# Patient Record
Sex: Female | Born: 1975 | Hispanic: Yes | Marital: Married
Health system: Midwestern US, Community
[De-identification: ages and names within clinical notes are randomized; demographics above are authoritative.]

## PROBLEM LIST (undated history)

## (undated) DIAGNOSIS — Z0000 Encounter for general adult medical examination without abnormal findings: Secondary | ICD-10-CM

## (undated) DIAGNOSIS — N63 Unspecified lump in unspecified breast: Secondary | ICD-10-CM

## (undated) DIAGNOSIS — M797 Fibromyalgia: Secondary | ICD-10-CM

## (undated) DIAGNOSIS — D649 Anemia, unspecified: Secondary | ICD-10-CM

## (undated) DIAGNOSIS — F419 Anxiety disorder, unspecified: Secondary | ICD-10-CM

## (undated) DIAGNOSIS — G8929 Other chronic pain: Secondary | ICD-10-CM

## (undated) DIAGNOSIS — M545 Low back pain, unspecified: Secondary | ICD-10-CM

## (undated) DIAGNOSIS — F329 Major depressive disorder, single episode, unspecified: Secondary | ICD-10-CM

## (undated) DIAGNOSIS — K219 Gastro-esophageal reflux disease without esophagitis: Secondary | ICD-10-CM

## (undated) DIAGNOSIS — F319 Bipolar disorder, unspecified: Secondary | ICD-10-CM

## (undated) DIAGNOSIS — IMO0002 Reserved for concepts with insufficient information to code with codable children: Secondary | ICD-10-CM

## (undated) DIAGNOSIS — F32A Depression, unspecified: Secondary | ICD-10-CM

## (undated) DIAGNOSIS — G43909 Migraine, unspecified, not intractable, without status migrainosus: Secondary | ICD-10-CM

## (undated) DIAGNOSIS — R519 Headache, unspecified: Secondary | ICD-10-CM

## (undated) DIAGNOSIS — R51 Headache: Secondary | ICD-10-CM

## (undated) DIAGNOSIS — Z9289 Personal history of other medical treatment: Secondary | ICD-10-CM

## (undated) DIAGNOSIS — L511 Stevens-Johnson syndrome: Secondary | ICD-10-CM

## (undated) HISTORY — PX: TUBAL LIGATION: SHX77

## (undated) HISTORY — PX: TRIGGER FINGER RELEASE: SHX641

## (undated) HISTORY — PX: LIPOMA EXCISION: SHX5283

## (undated) HISTORY — PX: BACK SURGERY: SHX140

## (undated) HISTORY — PX: CARPAL TUNNEL RELEASE: SHX101

## (undated) HISTORY — PX: LAPAROSCOPIC CHOLECYSTECTOMY: SUR755

---
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
## 2011-04-11 ENCOUNTER — Encounter (HOSPITAL_COMMUNITY)
Admission: RE | Admit: 2011-04-11 | Discharge: 2011-04-11 | Disposition: A | Discharge status: Home / Self Care | Payer: BC Managed Care – PPO | Source: Ambulatory Visit | Attending: Neurosurgery | Admitting: Neurosurgery

## 2011-04-11 LAB — HCG, SERUM, QUALITATIVE: Preg, Serum: NEGATIVE

## 2011-04-11 LAB — CBC
HCT: 33.7 % — ABNORMAL LOW (ref 36.0–46.0)
Hemoglobin: 11.2 g/dL — ABNORMAL LOW (ref 12.0–15.0)
MCH: 26.7 pg (ref 26.0–34.0)
MCHC: 33.2 g/dL (ref 30.0–36.0)
MCV: 80.2 fL (ref 78.0–100.0)
Platelets: 271 10*3/uL (ref 150–400)
RBC: 4.2 MIL/uL (ref 3.87–5.11)
RDW: 14 % (ref 11.5–15.5)
WBC: 8.2 10*3/uL (ref 4.0–10.5)

## 2011-04-11 LAB — SURGICAL PCR SCREEN
MRSA, PCR: NEGATIVE
Staphylococcus aureus: NEGATIVE

## 2011-04-16 ENCOUNTER — Observation Stay (HOSPITAL_COMMUNITY)
Admission: RE | Admit: 2011-04-16 | Discharge: 2011-04-17 | Disposition: A | Discharge status: Home / Self Care | Payer: BC Managed Care – PPO | Source: Ambulatory Visit | Attending: Neurosurgery | Admitting: Neurosurgery

## 2011-04-16 ENCOUNTER — Observation Stay (HOSPITAL_COMMUNITY): Payer: BC Managed Care – PPO

## 2011-04-16 DIAGNOSIS — M5030 Other cervical disc degeneration, unspecified cervical region: Principal | ICD-10-CM | POA: Insufficient documentation

## 2011-04-16 DIAGNOSIS — M5020 Other cervical disc displacement, unspecified cervical region: Secondary | ICD-10-CM | POA: Insufficient documentation

## 2011-04-16 DIAGNOSIS — F341 Dysthymic disorder: Secondary | ICD-10-CM | POA: Insufficient documentation

## 2011-04-16 DIAGNOSIS — Z01812 Encounter for preprocedural laboratory examination: Secondary | ICD-10-CM | POA: Insufficient documentation

## 2011-04-16 DIAGNOSIS — M47812 Spondylosis without myelopathy or radiculopathy, cervical region: Secondary | ICD-10-CM | POA: Insufficient documentation

## 2011-04-16 HISTORY — PX: ANTERIOR CERVICAL DECOMP/DISCECTOMY FUSION: SHX1161

## 2011-04-23 NOTE — Op Note (Signed)
Sherri James, Sherri James             ACCOUNT NO.:  [id number]  MEDICAL RECORD NO.:  [id number]           PATIENT TYPE:  O  LOCATION:  3526                         FACILITY:  [facility]  PHYSICIAN:  Hewitt Shorts, M.D.DATE OF BIRTH:  1976/10/09  DATE OF PROCEDURE:  04/16/2011 DATE OF DISCHARGE:                              OPERATIVE REPORT   PREOPERATIVE DIAGNOSIS:  Cervical disk herniation, cervical spondylosis, cervical degenerative disk disease, and cervical radiculopathy.  POSTOPERATIVE DIAGNOSIS:  Cervical disk herniation, cervical spondylosis, cervical degenerative disk disease, and cervical radiculopathy.  PROCEDURE:  C4-5, C5-6, and C6-7 anterior cervical decompression arthrodesis with allograft and tether cervical plating.  SURGEON:  Hewitt Shorts, MD  ASSISTANT:  Hilda Lias, MD  ANESTHESIA:  General endotracheal.  INDICATIONS:  The patient is a 35 year old woman who presented with neck pain, headaches, and pain, numbness, and tingling extending down to the upper extremities bilaterally, left worse than right.  She has been having symptoms dating back to 2005.  They have been steadily worsening. MRI scan shows three-level degeneration with spondylotic disk herniations and degenerative disk disease and a decision was made proceed with decompression and arthrodesis.  PROCEDURE:  The patient was brought to the operating room and placed under general endotracheal anesthesia.  The patient was placed in 10 pounds of Holter traction.  Neck was prepped with Betadine and soap solution and draped in sterile fashion.  Horizontal incision was made on the left side of the neck.  The line of the incision was infiltrated with local anesthetic with epinephrine.  Incision was made, carried down through the subcutaneous tissue and platysma.  Bipolar cautery and electrocautery were used to maintain hemostasis.  Dissection was then carried out through an avascular plane in the  sternocleidomastoid, carotid artery and jugular vein laterally and trachea and esophagus medially.  The ventral aspect of the vertebral column was identified and localizing x-ray was taken and the C4-5, C5-6, and C6-7 intervertebral disk space was identified.  Diskectomy was began at each level with incision of the annulus and continued with microcurettes and pituitary rongeurs.  Anterior osteophytic overgrowth was removed at each level using the osteophyte removal tool and Kerrison punches.  The operating microscope was draped and brought into the field to provide additional navigation, illumination, and visualization.  The remainder of decompression was performed using microdissection and microsurgical technique.  Diskectomy was continued posteriorly through the disk space. The endplates were prepared using microcurettes and the high-speed drill and then posterior osteophytic overgrowth was removed at each level using the high-speed drill along with a 2-mm Kerrison punch with a thin footplate.  Posterior longitudinal ligament was removed along with the spondylotic disk herniation at each level.  We were able to compress the spinal canal and thecal sac at each level.  We then turned our attention to the neural foramen where spinal encroaching was removed using a 2-mm Kerrison punch with thin footplate.  Once the decompression was completed of the neural foramina and exiting nerve roots at each level, we measured the height of the intervertebral disk spaces and selected 6-mm allograft implants.  Each of these allograft implants was  hydrated in a saline solution and then positioned in the intervertebral disk space and countersunk.  We did place some Gelfoam lateral to the graft for hemostasis and then we selected a 43-mm tether cervical plate and was positioned over the fusion construct and secured to the vertebra with 4 x 13 mm screws placing a single fixed screw at C4, single variable  screw at C5, and a single fixed screw at C6, and a pair of variable screws at C7.  Each screw hole was drilled and tapped.  Once all five screws were placed, final tightening was done.  At the C4 level, we only placed a screw in the left screw hole of the plate, no screw was placed in the right side. This plate itself was positioned slightly tilted to the right superiorly and it was felt that a screw at the right-sided C4 would not have good purchase with the vertebral body; however, solid construct was felt to have been achieved.  X-ray showed graft in good position, plate and screws in good position, the alignment was good and then the wound was irrigated with bacitracin solution, checked for hemostasis, which was established and confirmed.  Then, we proceeded with closure.  Platysma was closed with interrupted inverted 2-0 undyed Vicryl sutures. Subcutaneous and subcuticular were closed with interrupted 3-0 undyed Vicryl sutures.  Skin was approximated with Dermabond.  The procedure was tolerated well.  Estimated blood loss was 250 mL.  Sponge and needles count correct.  Following surgery, the patient was taken out of cervical traction.  The anesthetic was reversed.  The patient was extubated and transferred to recovery room for further care where she was noted to be moving all four extremities to command.     Hewitt Shorts, M.D.     RWN/MEDQ  D:  04/16/2011  T:  04/17/2011  Job:  [id number]  Electronically Signed by Shirlean Kelly M.D. on 04/23/2011 09:48:27 AM

## 2011-05-21 LAB — TYPE & SCREEN
ABO/Rh(D): O POS
Antibody screen: NEGATIVE

## 2011-05-21 LAB — CBC WITH AUTOMATED DIFF
ABS. BASOPHILS: 0 10*3/uL (ref 0.0–0.1)
ABS. EOSINOPHILS: 0 10*3/uL (ref 0.0–0.7)
ABS. LYMPHOCYTES: 1.4 10*3/uL — ABNORMAL LOW (ref 1.5–3.5)
ABS. MONOCYTES: 0.5 10*3/uL (ref 0.0–1.0)
ABS. NEUTROPHILS: 7.6 10*3/uL — ABNORMAL HIGH (ref 1.5–6.6)
BASOPHILS: 0 % (ref 0.0–3.0)
EOSINOPHILS: 0 % (ref 0.0–7.0)
HCT: 40 % (ref 36.0–47.0)
HGB: 13.8 g/dL (ref 12.0–16.0)
IMMATURE GRANULOCYTES: 1 % (ref 0.0–2.0)
LYMPHOCYTES: 14 % — ABNORMAL LOW (ref 18.0–40.0)
MCH: 31.3 PG (ref 27.0–35.0)
MCHC: 34.5 g/dL (ref 30.7–37.3)
MCV: 90.7 FL (ref 81.0–94.0)
MONOCYTES: 6 % (ref 2.0–12.0)
MPV: 12.2 FL — ABNORMAL HIGH (ref 9.2–11.8)
NEUTROPHILS: 80 % — ABNORMAL HIGH (ref 48.0–72.0)
PLATELET: 170 10*3/uL (ref 130–400)
RBC: 4.41 M/uL (ref 4.20–5.40)
RDW: 13.1 % (ref 11.5–14.0)
WBC: 9.7 10*3/uL (ref 4.8–10.6)

## 2011-05-21 LAB — TYPE AND SCREEN
ABO/Rh: O POS
Antibody Screen: NEGATIVE

## 2011-05-22 LAB — CBC WITH AUTOMATED DIFF
ABS. BASOPHILS: 0 10*3/uL (ref 0.0–0.1)
ABS. EOSINOPHILS: 0.1 10*3/uL (ref 0.0–0.7)
ABS. LYMPHOCYTES: 2.4 10*3/uL (ref 1.5–3.5)
ABS. MONOCYTES: 0.7 10*3/uL (ref 0.0–1.0)
ABS. NEUTROPHILS: 6.6 10*3/uL (ref 1.5–6.6)
BASOPHILS: 0 % (ref 0.0–3.0)
EOSINOPHILS: 1 % (ref 0.0–7.0)
HCT: 33.1 % — ABNORMAL LOW (ref 36.0–47.0)
HGB: 11.5 g/dL — ABNORMAL LOW (ref 12.0–16.0)
IMMATURE GRANULOCYTES: 0.7 % (ref 0.0–2.0)
LYMPHOCYTES: 24 % (ref 18.0–40.0)
MCH: 31.8 PG (ref 27.0–35.0)
MCHC: 34.7 g/dL (ref 30.7–37.3)
MCV: 91.4 FL (ref 81.0–94.0)
MONOCYTES: 7 % (ref 2.0–12.0)
MPV: 11.9 FL — ABNORMAL HIGH (ref 9.2–11.8)
NEUTROPHILS: 68 % (ref 48.0–72.0)
PLATELET: 176 10*3/uL (ref 130–400)
RBC: 3.62 M/uL — ABNORMAL LOW (ref 4.20–5.40)
RDW: 13.3 % (ref 11.5–14.0)
WBC: 9.8 10*3/uL (ref 4.8–10.6)

## 2012-07-07 ENCOUNTER — Encounter (INDEPENDENT_AMBULATORY_CARE_PROVIDER_SITE_OTHER): Payer: Self-pay | Admitting: General Surgery

## 2013-11-07 ENCOUNTER — Other Ambulatory Visit: Payer: Self-pay | Admitting: Orthopedic Surgery

## 2013-11-25 ENCOUNTER — Encounter (HOSPITAL_COMMUNITY): Payer: Self-pay | Admitting: Respiratory Therapy

## 2013-11-25 NOTE — Pre-Procedure Instructions (Signed)
Leonie GreenMaria Strandberg  11/25/2013   Your procedure is scheduled on: Thursday, Jan. 15th.             (Free Valet Parking is available)   [facility]  2 * 3 at 8:00 AM.  Call this number if you have problems the morning of surgery: [phone]   Remember:   Do not eat food or drink liquids after midnight Wednesday.   Take these medicines the morning of surgery with A SIP OF WATER: Valium. Please take only 1 of the pain medications   Do not wear jewelry, make-up or nail polish.  Do not wear lotions, powders, or perfumes. You may wear deodorant.  Do not shave underarms & legs 48 hours prior to surgery.   Do not bring valuables to the hospital.  [facility] any belongings or valuables.               Contacts, dentures or bridgework may not be worn into surgery.  Leave suitcase in the car. After surgery it may be brought to your room.  For patients admitted to the hospital, discharge time is determined by your treatment team.              Name and phone number of your driver:    Special Instructions: Shower using CHG 2 nights before surgery and the night before surgery.  If you shower the day of surgery use CHG.  Use special wash - you have one bottle of CHG for all showers.  You should use approximately 1/3 of the bottle for each shower.   Please read over the following fact sheets that you were given: Pain Booklet, Blood Transfusion Information, MRSA Information and Surgical Site Infection Prevention

## 2013-11-28 ENCOUNTER — Encounter (HOSPITAL_COMMUNITY): Payer: Self-pay

## 2013-11-28 ENCOUNTER — Ambulatory Visit (HOSPITAL_COMMUNITY)
Admission: RE | Admit: 2013-11-28 | Discharge: 2013-11-28 | Disposition: A | Discharge status: Home / Self Care | Payer: BC Managed Care – PPO | Source: Ambulatory Visit | Attending: Orthopedic Surgery | Admitting: Orthopedic Surgery

## 2013-11-28 ENCOUNTER — Encounter (HOSPITAL_COMMUNITY)
Admission: RE | Admit: 2013-11-28 | Discharge: 2013-11-28 | Disposition: A | Discharge status: Home / Self Care | Payer: BC Managed Care – PPO | Source: Ambulatory Visit | Attending: Orthopedic Surgery | Admitting: Orthopedic Surgery

## 2013-11-28 DIAGNOSIS — Z01818 Encounter for other preprocedural examination: Secondary | ICD-10-CM | POA: Insufficient documentation

## 2013-11-28 DIAGNOSIS — Z01812 Encounter for preprocedural laboratory examination: Secondary | ICD-10-CM | POA: Insufficient documentation

## 2013-11-28 DIAGNOSIS — Z01810 Encounter for preprocedural cardiovascular examination: Secondary | ICD-10-CM | POA: Insufficient documentation

## 2013-11-28 HISTORY — DX: Reserved for concepts with insufficient information to code with codable children: IMO0002

## 2013-11-28 HISTORY — DX: Stevens-Johnson syndrome: L51.1

## 2013-11-28 HISTORY — DX: Bipolar disorder, unspecified: F31.9

## 2013-11-28 HISTORY — DX: Anxiety disorder, unspecified: F41.9

## 2013-11-28 HISTORY — DX: Anemia, unspecified: D64.9

## 2013-11-28 HISTORY — DX: Major depressive disorder, single episode, unspecified: F32.9

## 2013-11-28 HISTORY — DX: Depression, unspecified: F32.A

## 2013-11-28 HISTORY — DX: Fibromyalgia: M79.7

## 2013-11-28 LAB — COMPREHENSIVE METABOLIC PANEL
ALBUMIN: 4.1 g/dL (ref 3.5–5.2)
ALT: 13 U/L (ref 0–35)
AST: 18 U/L (ref 0–37)
Alkaline Phosphatase: 66 U/L (ref 39–117)
BILIRUBIN TOTAL: 0.2 mg/dL — AB (ref 0.3–1.2)
BUN: 7 mg/dL (ref 6–23)
CO2: 27 mEq/L (ref 19–32)
CREATININE: 0.69 mg/dL (ref 0.50–1.10)
Calcium: 9.4 mg/dL (ref 8.4–10.5)
Chloride: 106 mEq/L (ref 96–112)
GFR calc Af Amer: >90 mL/min (ref >90)
GFR calc non Af Amer: >90 mL/min (ref >90)
Glucose, Bld: 71 mg/dL (ref 70–99)
Potassium: 4 mEq/L (ref 3.7–5.3)
Sodium: 144 mEq/L (ref 137–147)
TOTAL PROTEIN: 7.2 g/dL (ref 6.0–8.3)

## 2013-11-28 LAB — URINALYSIS, ROUTINE W REFLEX MICROSCOPIC
BILIRUBIN URINE: NEGATIVE
Glucose, UA: NEGATIVE mg/dL
Hgb urine dipstick: NEGATIVE
Ketones, ur: NEGATIVE mg/dL
NITRITE: NEGATIVE
PH: 6 (ref 5.0–8.0)
PROTEIN: NEGATIVE mg/dL
Specific Gravity, Urine: 1.027 (ref 1.005–1.030)
Urobilinogen, UA: 0.2 mg/dL (ref 0.0–1.0)

## 2013-11-28 LAB — URINE MICROSCOPIC-ADD ON

## 2013-11-28 LAB — CBC WITH DIFFERENTIAL/PLATELET
BASOS ABS: 0 10*3/uL (ref 0.0–0.1)
Basophils Relative: 1 % (ref 0–1)
Eosinophils Absolute: 0.3 10*3/uL (ref 0.0–0.7)
Eosinophils Relative: 4 % (ref 0–5)
HEMATOCRIT: 40.2 % (ref 36.0–46.0)
HEMOGLOBIN: 13.2 g/dL (ref 12.0–15.0)
LYMPHS ABS: 2.8 10*3/uL (ref 0.7–4.0)
LYMPHS PCT: 44 % (ref 12–46)
MCH: 26.1 pg (ref 26.0–34.0)
MCHC: 32.8 g/dL (ref 30.0–36.0)
MCV: 79.4 fL (ref 78.0–100.0)
MONO ABS: 0.4 10*3/uL (ref 0.1–1.0)
MONOS PCT: 7 % (ref 3–12)
NEUTROS ABS: 2.8 10*3/uL (ref 1.7–7.7)
Neutrophils Relative %: 45 % (ref 43–77)
Platelets: 344 10*3/uL (ref 150–400)
RBC: 5.06 MIL/uL (ref 3.87–5.11)
RDW: 17.9 % — AB (ref 11.5–15.5)
WBC: 6.3 10*3/uL (ref 4.0–10.5)

## 2013-11-28 LAB — PROTIME-INR
INR: 1.06 (ref 0.00–1.49)
PROTHROMBIN TIME: 13.6 s (ref 11.6–15.2)

## 2013-11-28 LAB — TYPE AND SCREEN
ABO/RH(D): A POS
ANTIBODY SCREEN: NEGATIVE

## 2013-11-28 LAB — SURGICAL PCR SCREEN
MRSA, PCR: NEGATIVE
Staphylococcus aureus: NEGATIVE

## 2013-11-28 LAB — APTT: APTT: 33 s (ref 24–37)

## 2013-11-28 LAB — ABO/RH: ABO/RH(D): A POS

## 2013-11-28 LAB — HCG, SERUM, QUALITATIVE: Preg, Serum: NEGATIVE

## 2013-11-30 MED ORDER — CEFAZOLIN SODIUM-DEXTROSE 2-3 GM-% IV SOLR: 2.0000 g | INTRAVENOUS | Status: DC

## 2013-12-01 ENCOUNTER — Encounter (HOSPITAL_COMMUNITY): Admission: RE | Payer: Self-pay | Source: Ambulatory Visit

## 2013-12-01 ENCOUNTER — Ambulatory Visit (HOSPITAL_COMMUNITY): Admission: RE | Admit: 2013-12-01 | Payer: Medicaid Other | Source: Ambulatory Visit | Admitting: Orthopedic Surgery

## 2013-12-01 SURGERY — LUMBAR LAMINECTOMY/DECOMPRESSION MICRODISCECTOMY
Anesthesia: General | Laterality: Right

## 2014-01-11 ENCOUNTER — Encounter

## 2014-02-08 ENCOUNTER — Encounter

## 2014-10-08 ENCOUNTER — Emergency Department: Admit: 2014-10-08 | Payer: MEDICAID

## 2014-10-08 ENCOUNTER — Inpatient Hospital Stay
Admit: 2014-10-08 | Discharge: 2014-10-09 | Disposition: A | Discharge status: Home / Self Care | Payer: MEDICAID | Attending: Personal Emergency Response Attendant

## 2014-10-08 DIAGNOSIS — O039 Complete or unspecified spontaneous abortion without complication: Secondary | ICD-10-CM

## 2014-10-08 LAB — CBC WITH AUTOMATED DIFF
ABS. BASOPHILS: 0 10*3/uL (ref 0.0–0.1)
ABS. EOSINOPHILS: 0.1 10*3/uL (ref 0.0–0.7)
ABS. LYMPHOCYTES: 2.2 10*3/uL (ref 1.5–3.5)
ABS. MONOCYTES: 0.7 10*3/uL (ref 0.0–1.0)
ABS. NEUTROPHILS: 6.8 10*3/uL — ABNORMAL HIGH (ref 1.5–6.6)
BASOPHILS: 0 % (ref 0.0–3.0)
EOSINOPHILS: 1 % (ref 0.0–7.0)
HCT: 37.7 % (ref 36.0–47.0)
HGB: 12.3 g/dL (ref 12.0–16.0)
IMMATURE GRANULOCYTES: 0.3 % (ref 0.0–2.0)
LYMPHOCYTES: 22 % (ref 18.0–40.0)
MCH: 28.2 PG (ref 27.0–35.0)
MCHC: 32.6 g/dL (ref 30.7–37.3)
MCV: 86.5 FL (ref 81.0–94.0)
MONOCYTES: 7 % (ref 2.0–12.0)
MPV: 12 FL — ABNORMAL HIGH (ref 9.2–11.8)
NEUTROPHILS: 70 % (ref 48.0–72.0)
PLATELET: 247 10*3/uL (ref 130–400)
RBC: 4.36 M/uL (ref 4.20–5.40)
RDW: 15.3 % — ABNORMAL HIGH (ref 11.5–14.0)
WBC: 9.8 10*3/uL (ref 4.8–10.6)

## 2014-10-08 MED ORDER — SODIUM CHLORIDE 0.9% BOLUS IV
0.9 % | Freq: Once | INTRAVENOUS | Status: AC
Start: 2014-10-08 — End: 2014-10-08
  Administered 2014-10-08: 23:00:00 via INTRAVENOUS

## 2014-10-08 MED FILL — SODIUM CHLORIDE 0.9 % IV: INTRAVENOUS | Qty: 1000

## 2014-10-08 NOTE — ED Notes (Signed)
Pt given cytocel anotheliter of fluid and dcd to home

## 2014-10-08 NOTE — Consults (Signed)
Gynecology History and Physical    Name: Christina Colon MRN: [id number] SSN: [id number]    Date of Birth: 1976/01/10  Age: 38 y.o.  Sex: female       Subjective:      Chief complaint:  Bleeding and pain, [redacted] weeks pregnant    Christina Colon is a 38 y.o. woman pregnant with EDD 04/26/15 who presents to the ED c/o sudden onset of heavy vaginal bleeding with associated cramping. While in triage area, was noted to pass tissue with  An identifiable fetus. Prenatal course had been unremarkable prior to today.     OB History     Gravida Para Term Preterm AB TAB SAB Ectopic Multiple Living    '3 2 2       2        ' History reviewed. No pertinent past medical history.  History reviewed. No pertinent past surgical history.  Social History     Occupational History   ??? Not on file.     Social History Main Topics   ??? Smoking status: Never Smoker    ??? Smokeless tobacco: Not on file   ??? Alcohol Use: No   ??? Drug Use: Not on file   ??? Sexual Activity: Not on file     History reviewed. No pertinent family history.     No Known Allergies  Prior to Admission medications    Medication Sig Start Date End Date Taking? Authorizing Provider   misoprostol (CYTOTEC) 200 mcg tablet Take 2 Tabs by mouth two (2) times a day for 2 doses. Indications: ABORTION, spontaneous 10/08/14 10/09/14 Yes Rae Roam, MD         Review of Systems  Pertinent items are noted in the History of Present Illness.    Objective:     Filed Vitals:    10/08/14 1751   BP: 113/67   Temp: 98.4 ??F (36.9 ??C)   Resp: 22   Height: '5\' 4"'  (1.626 m)   Weight: 72.576 kg (160 lb)   SpO2: 99%       Physical Exam:  Patient without distress.  Abdomen: soft, nontender  Vagina: presence of blood and tissue in vault, extracted with ring forceps  Cervix: normal appearance and closed  Uterus: normal single, nontender, anteverted and 8 week size    Recent Results (from the past 8 hour(s))   TOTAL HCG, QT.    Collection Time: 10/08/14  6:40 PM   Result Value Ref Range     HCG,INTACT 2214 (H) <3 MIU/mL   METABOLIC PANEL, COMPREHENSIVE    Collection Time: 10/08/14  6:40 PM   Result Value Ref Range    Sodium 140 136 - 145 mmol/L    Potassium 3.6 3.5 - 5.1 mmol/L    Chloride 107 98 - 107 mmol/L    CO2 25 21 - 32 mmol/L    Anion gap 12 10 - 20 mmol/L    Glucose 96 74 - 106 mg/dL    BUN 8 7 - 18 mg/dL    Creatinine 0.6 0.6 - 1.3 mg/dL    GFR est AA >60 >60 ml/min/1.47m    GFR est non-AA >60 >60 ml/min/1.747m   Calcium 8.4 (L) 8.5 - 10.1 mg/dL    Bilirubin, total 0.3 0.2 - 1.0 mg/dL    ALT 19 13 - 61 U/L    AST 11 (L) 15 - 37 U/L    Alk. phosphatase 72 45 - 117 U/L    Protein, total 7.5 6.4 -  8.2 g/dL    Albumin 3.6 3.5 - 4.7 g/dL    Globulin 3.9 1.7 - 4.7 g/dL    A-G Ratio 0.9 0.7 - 2.8     CBC WITH AUTOMATED DIFF    Collection Time: 10/08/14  6:40 PM   Result Value Ref Range    WBC 9.8 4.8 - 10.6 K/uL    RBC 4.36 4.20 - 5.40 M/uL    HGB 12.3 12.0 - 16.0 g/dL    HCT 37.7 36.0 - 47.0 %    MCV 86.5 81.0 - 94.0 FL    MCH 28.2 27.0 - 35.0 PG    MCHC 32.6 30.7 - 37.3 g/dL    RDW 15.3 (H) 11.5 - 14.0 %    PLATELET 247 130 - 400 K/uL    MPV 12.0 (H) 9.2 - 11.8 FL    NEUTROPHILS 70 48.0 - 72.0 %    LYMPHOCYTES 22 18.0 - 40.0 %    MONOCYTES 7 2.0 - 12.0 %    EOSINOPHILS 1 0.0 - 7.0 %    BASOPHILS 0 0.0 - 3.0 %    ABS. NEUTROPHILS 6.8 (H) 1.5 - 6.6 K/UL    ABS. LYMPHOCYTES 2.2 1.5 - 3.5 K/UL    ABS. MONOCYTES 0.7 0.0 - 1.0 K/UL    ABS. EOSINOPHILS 0.1 0.0 - 0.7 K/UL    ABS. BASOPHILS 0.0 0.0 - 0.1 K/UL    DF AUTOMATED      IMMATURE GRANULOCYTES 0.3 0.0 - 2.0 %   PROTHROMBIN TIME + INR    Collection Time: 10/08/14  6:40 PM   Result Value Ref Range    Prothrombin time 9.6 9.4 - 11.1 sec    INR 1.0 0.8 - 1.2     PTT    Collection Time: 10/08/14  6:40 PM   Result Value Ref Range    aPTT 24.2 22.0 - 29.0 [place]       [place] reviewed with technician. Uterine corpus measures 12 cm with 1 cm homogeneous endometrium. Marked heterogenous material present at level of  cervix/vagina (study performed prior to above physical exam).      Assessment/Plan:     38 yr old G3P2 [redacted] weeks gestation s/p spontaneous abortion-likely completed. Will give misoprostol and IV fluids prior to discharge, and rx 2 additional doses of misoprostol for patient to take tomorrow.     Signed By:  Rae Roam, MD     October 08, 2014

## 2014-10-08 NOTE — ED Notes (Signed)
Dr POlio here to consult pt pt to have cytotec and 1 more liter saline check blood pressure and dc to home

## 2014-10-08 NOTE — ED Provider Notes (Signed)
HPI Comments: 5:22 PM  Chief complaint: vaginal bleeding  History obtained from patient    Christina Colon is a 38 y.o. female G3P2002 at 13 weeks (by date of LMP 07/06/14) complains of vaginal bleeding (about the same as a regular period) which started approx 1 hour ago. She complains of mild, intermittent, cramping, non-radiating pelvic pain. Has used many pads since onset.  Large clots. Denies dysuria, urinary frequency, urgency or hematuria.  No flank pain.   No fever or chills. No nausea, vomiting, diarrhea or constipation. No weakness, numbness or tingling.    She has had an ultrasound this pregnancy which showed normal results to her knowledge.    OB/GYN: Roe Coombs, MD  Patient's last menstrual period was 07/06/2014.            History reviewed. No pertinent past medical history.;     History reviewed. No pertinent past surgical history.      History reviewed. No pertinent family history.     History     Social History   ??? Marital Status: MARRIED     Spouse Name: N/A     Number of Children: N/A   ??? Years of Education: N/A     Occupational History   ??? Not on file.     Social History Main Topics   ??? Smoking status: Never Smoker    ??? Smokeless tobacco: Not on file   ??? Alcohol Use: No   ??? Drug Use: Not on file   ??? Sexual Activity: Not on file     Other Topics Concern   ??? Not on file     Social History Narrative   ??? No narrative on file                ALLERGIES: Review of patient's allergies indicates no known allergies.      Review of Systems   Constitutional: Negative for fever and chills.   HENT: Negative for sore throat.    Respiratory: Negative for cough.    Cardiovascular: Negative for chest pain.   Gastrointestinal: Negative for nausea, vomiting and abdominal pain.   Genitourinary: Positive for vaginal bleeding. Negative for dysuria and flank pain.   Musculoskeletal: Negative for back pain and neck pain.   Skin: Negative for wound.   Allergic/Immunologic: Negative for immunocompromised state.    Neurological: Negative for dizziness and headaches.       Filed Vitals:    10/08/14 1751   BP: 113/67   Temp: 98.4 ??F (36.9 ??C)   Resp: 22   Height: '5\' 4"'  (1.626 m)   Weight: 72.576 kg (160 lb)   SpO2: 99%            Physical Exam   Constitutional: She is oriented to person, place, and time. Vital signs are normal. She appears well-developed and well-nourished. No distress.   HENT:   Head: Normocephalic and atraumatic.   Eyes: Conjunctivae are normal.   Neck: Normal range of motion. Neck supple.   Cardiovascular: Normal rate and regular rhythm.    Pulmonary/Chest: Effort normal and breath sounds normal.   Abdominal: Soft. She exhibits no distension. There is no tenderness. There is no rebound and no guarding.   Genitourinary:   Large clots in vaginal vault   Neurological: She is alert and oriented to person, place, and time.   Skin: Skin is warm and dry. She is not diaphoretic.   Psychiatric:   Calm and cooperative    Nursing note and vitals  reviewed.       MDM  Number of Diagnoses or Management Options  Spontaneous abortion in first trimester:      Amount and/or Complexity of Data Reviewed  Clinical lab tests: reviewed and ordered  Tests in the radiology section of CPT??: reviewed and ordered  Obtain history from someone other than the patient: yes  Discuss the patient with other providers: yes        Procedures      Labs:  Recent Results (from the past 12 hour(s))   TOTAL HCG, QT.    Collection Time: 10/08/14  6:40 PM   Result Value Ref Range    HCG,INTACT 2214 (H) <3 MIU/mL   METABOLIC PANEL, COMPREHENSIVE    Collection Time: 10/08/14  6:40 PM   Result Value Ref Range    Sodium 140 136 - 145 mmol/L    Potassium 3.6 3.5 - 5.1 mmol/L    Chloride 107 98 - 107 mmol/L    CO2 25 21 - 32 mmol/L    Anion gap 12 10 - 20 mmol/L    Glucose 96 74 - 106 mg/dL    BUN 8 7 - 18 mg/dL    Creatinine 0.6 0.6 - 1.3 mg/dL    GFR est AA >60 >60 ml/min/1.29m    GFR est non-AA >60 >60 ml/min/1.755m   Calcium 8.4 (L) 8.5 - 10.1 mg/dL     Bilirubin, total 0.3 0.2 - 1.0 mg/dL    ALT 19 13 - 61 U/L    AST 11 (L) 15 - 37 U/L    Alk. phosphatase 72 45 - 117 U/L    Protein, total 7.5 6.4 - 8.2 g/dL    Albumin 3.6 3.5 - 4.7 g/dL    Globulin 3.9 1.7 - 4.7 g/dL    A-G Ratio 0.9 0.7 - 2.8     CBC WITH AUTOMATED DIFF    Collection Time: 10/08/14  6:40 PM   Result Value Ref Range    WBC 9.8 4.8 - 10.6 K/uL    RBC 4.36 4.20 - 5.40 M/uL    HGB 12.3 12.0 - 16.0 g/dL    HCT 37.7 36.0 - 47.0 %    MCV 86.5 81.0 - 94.0 FL    MCH 28.2 27.0 - 35.0 PG    MCHC 32.6 30.7 - 37.3 g/dL    RDW 15.3 (H) 11.5 - 14.0 %    PLATELET 247 130 - 400 K/uL    MPV 12.0 (H) 9.2 - 11.8 FL    NEUTROPHILS 70 48.0 - 72.0 %    LYMPHOCYTES 22 18.0 - 40.0 %    MONOCYTES 7 2.0 - 12.0 %    EOSINOPHILS 1 0.0 - 7.0 %    BASOPHILS 0 0.0 - 3.0 %    ABS. NEUTROPHILS 6.8 (H) 1.5 - 6.6 K/UL    ABS. LYMPHOCYTES 2.2 1.5 - 3.5 K/UL    ABS. MONOCYTES 0.7 0.0 - 1.0 K/UL    ABS. EOSINOPHILS 0.1 0.0 - 0.7 K/UL    ABS. BASOPHILS 0.0 0.0 - 0.1 K/UL    DF AUTOMATED      IMMATURE GRANULOCYTES 0.3 0.0 - 2.0 %   PROTHROMBIN TIME + INR    Collection Time: 10/08/14  6:40 PM   Result Value Ref Range    Prothrombin time 9.6 9.4 - 11.1 sec    INR 1.0 0.8 - 1.2     PTT    Collection Time: 10/08/14  6:40 PM   Result  Value Ref Range    aPTT 24.2 22.0 - 29.0 SEC   TYPE & SCREEN    Collection Time: 10/08/14  6:40 PM   Result Value Ref Range    Crossmatch Expiration 10/11/2014     ABO/Rh(D) O POSITIVE     Antibody screen NEG        Lab Results   Component Value Date/Time    ABO/RH(D) O POSITIVE 10/08/2014 06:40 PM       Radiology: Transvaginal US: large blood clots visualized in uterus, no definitive retained POC.     Consults: 6:00 PM Dr. Domenick Gong (covering Dr. Marlana Salvage) made aware that pt is in the ED and diagnostic tests are pending  7:15 PM Dr. Domenick Gong in the ED evaluating patient.     <EMERGENCY DEPARTMENT CASE SUMMARY>    Impression/Differential Diagnosis: spontaneous abortion     Plan: Labs, transvaginal ultrasound, IVF, consult OB/GYN    ED Course: Pt non-distressed in the ED with normal vitals. IVF given. Transvaginal US reveals no definitive evidence of retained POC. Large blood clots seen. Dr. Domenick Gong evaluated pt at bedside and manually removed numerous large blood clots. Pt was monitored and her vitals remained stable. Pt was given cytotec in the ED and will be discharged with an Rx for another dose (called in by Dr. Domenick Gong). Stable for d/c, to follow up with primary OB/GYN in 10 days.    D/w patient and spouse test results, dx, tx plan, and need for follow-up. Pt advised to RTER for new or worsening symptoms. The patient and spouse verbalized understanding. All questions answered, and the patient and spouse agrees with the plan.      Final Impression/Diagnosis:   1. Spontaneous abortion in first trimester        Patient condition at time of disposition:  stable     I have reviewed the following home medications:    Prior to Admission medications    Medication Sig Start Date End Date Taking? Authorizing Provider   misoprostol (CYTOTEC) 200 mcg tablet Take 2 Tabs by mouth two (2) times a day for 2 doses. Indications: ABORTION, spontaneous 10/08/14 10/09/14 Yes Rae Roam, MD       Lurline Idol, NP

## 2014-10-08 NOTE — ED Notes (Signed)
Report given to Ross RN .

## 2014-10-08 NOTE — ED Notes (Signed)
Pt sts 30 min before arrival to ed she began vaginally bleeding. As pt undressed fully formed fetus found in underwear with approx of blood. Clotted blood passed as well. Pt denies pain. NP [place] in to see.

## 2014-10-09 LAB — METABOLIC PANEL, COMPREHENSIVE
A-G Ratio: 0.9 (ref 0.7–2.8)
ALT (SGPT): 19 U/L (ref 13–61)
AST (SGOT): 11 U/L — ABNORMAL LOW (ref 15–37)
Albumin: 3.6 g/dL (ref 3.5–4.7)
Alk. phosphatase: 72 U/L (ref 45–117)
Anion gap: 12 mmol/L (ref 10–20)
BUN: 8 mg/dL (ref 7–18)
Bilirubin, total: 0.3 mg/dL (ref 0.2–1.0)
CO2: 25 mmol/L (ref 21–32)
Calcium: 8.4 mg/dL — ABNORMAL LOW (ref 8.5–10.1)
Chloride: 107 mmol/L (ref 98–107)
Creatinine: 0.6 mg/dL (ref 0.6–1.3)
GFR est AA: >60 mL/min/{1.73_m2} (ref >60)
GFR est non-AA: >60 mL/min/{1.73_m2} (ref >60)
Globulin: 3.9 g/dL (ref 1.7–4.7)
Glucose: 96 mg/dL (ref 74–106)
Potassium: 3.6 mmol/L (ref 3.5–5.1)
Protein, total: 7.5 g/dL (ref 6.4–8.2)
Sodium: 140 mmol/L (ref 136–145)

## 2014-10-09 LAB — PROTHROMBIN TIME + INR
INR: 1 (ref 0.8–1.2)
Prothrombin time: 9.6 s (ref 9.4–11.1)

## 2014-10-09 LAB — BETA HCG, QT
HCG,INTACT, THCGA1: 2214 m[IU]/mL — ABNORMAL HIGH (ref <3)
HCG,INTACT: 2214 m[IU]/mL — ABNORMAL HIGH (ref <3)

## 2014-10-09 LAB — TYPE & SCREEN
ABO/Rh(D): O POS
Antibody screen: NEGATIVE

## 2014-10-09 LAB — PTT: aPTT: 24.2 s (ref 22.0–29.0)

## 2014-10-09 LAB — TYPE AND SCREEN
ABO/Rh: O POS
Antibody Screen: NEGATIVE

## 2014-10-09 MED ORDER — SODIUM CHLORIDE 0.9% BOLUS IV
0.9 % | Freq: Once | INTRAVENOUS | Status: AC
Start: 2014-10-09 — End: 2014-10-08
  Administered 2014-10-09: via INTRAVENOUS

## 2014-10-09 MED ORDER — MISOPROSTOL 200 MCG TAB
200 mcg | Freq: Once | ORAL | Status: DC
Start: 2014-10-09 — End: 2014-10-09

## 2014-10-09 MED ORDER — MISOPROSTOL 200 MCG TAB
200 mcg | ORAL_TABLET | Freq: Two times a day (BID) | ORAL | Status: AC
Start: 2014-10-09 — End: 2014-10-09

## 2014-10-09 MED ORDER — SODIUM CHLORIDE 0.9% BOLUS IV
0.9 % | Freq: Once | INTRAVENOUS | Status: AC
Start: 2014-10-09 — End: 2014-10-08
  Administered 2014-10-09: 01:00:00 via INTRAVENOUS

## 2014-10-09 MED FILL — SODIUM CHLORIDE 0.9 % IV: INTRAVENOUS | Qty: 1000

## 2014-10-09 MED FILL — MISOPROSTOL 200 MCG TAB: 200 mcg | ORAL | Qty: 2

## 2016-08-12 DIAGNOSIS — F329 Major depressive disorder, single episode, unspecified: Secondary | ICD-10-CM

## 2016-08-12 DIAGNOSIS — E871 Hypo-osmolality and hyponatremia: Secondary | ICD-10-CM

## 2016-08-12 DIAGNOSIS — N151 Renal and perinephric abscess: Secondary | ICD-10-CM

## 2016-08-12 DIAGNOSIS — A419 Sepsis, unspecified organism: Secondary | ICD-10-CM

## 2016-08-12 DIAGNOSIS — E876 Hypokalemia: Secondary | ICD-10-CM

## 2016-08-12 DIAGNOSIS — M549 Dorsalgia, unspecified: Secondary | ICD-10-CM

## 2016-08-12 DIAGNOSIS — N12 Tubulo-interstitial nephritis, not specified as acute or chronic: Secondary | ICD-10-CM

## 2016-08-13 DIAGNOSIS — E876 Hypokalemia: Secondary | ICD-10-CM | POA: Diagnosis not present

## 2016-08-13 DIAGNOSIS — N151 Renal and perinephric abscess: Secondary | ICD-10-CM | POA: Diagnosis not present

## 2016-08-13 DIAGNOSIS — N12 Tubulo-interstitial nephritis, not specified as acute or chronic: Secondary | ICD-10-CM | POA: Diagnosis not present

## 2016-08-13 DIAGNOSIS — A419 Sepsis, unspecified organism: Secondary | ICD-10-CM | POA: Diagnosis not present

## 2016-08-14 DIAGNOSIS — N151 Renal and perinephric abscess: Secondary | ICD-10-CM | POA: Diagnosis not present

## 2016-08-14 DIAGNOSIS — E876 Hypokalemia: Secondary | ICD-10-CM | POA: Diagnosis not present

## 2016-08-14 DIAGNOSIS — N12 Tubulo-interstitial nephritis, not specified as acute or chronic: Secondary | ICD-10-CM | POA: Diagnosis not present

## 2016-08-14 DIAGNOSIS — A419 Sepsis, unspecified organism: Secondary | ICD-10-CM | POA: Diagnosis not present

## 2016-08-15 DIAGNOSIS — N12 Tubulo-interstitial nephritis, not specified as acute or chronic: Secondary | ICD-10-CM | POA: Diagnosis not present

## 2016-08-15 DIAGNOSIS — A419 Sepsis, unspecified organism: Secondary | ICD-10-CM | POA: Diagnosis not present

## 2016-08-15 DIAGNOSIS — E876 Hypokalemia: Secondary | ICD-10-CM | POA: Diagnosis not present

## 2016-08-15 DIAGNOSIS — N151 Renal and perinephric abscess: Secondary | ICD-10-CM | POA: Diagnosis not present

## 2016-08-16 DIAGNOSIS — N151 Renal and perinephric abscess: Secondary | ICD-10-CM | POA: Diagnosis not present

## 2016-08-16 DIAGNOSIS — E876 Hypokalemia: Secondary | ICD-10-CM | POA: Diagnosis not present

## 2016-08-16 DIAGNOSIS — N12 Tubulo-interstitial nephritis, not specified as acute or chronic: Secondary | ICD-10-CM | POA: Diagnosis not present

## 2016-08-16 DIAGNOSIS — A419 Sepsis, unspecified organism: Secondary | ICD-10-CM | POA: Diagnosis not present

## 2016-09-10 ENCOUNTER — Emergency Department (HOSPITAL_COMMUNITY): Payer: BLUE CROSS/BLUE SHIELD

## 2016-09-10 ENCOUNTER — Encounter (HOSPITAL_COMMUNITY): Payer: Self-pay | Admitting: Emergency Medicine

## 2016-09-10 ENCOUNTER — Inpatient Hospital Stay (HOSPITAL_COMMUNITY)
Admission: EM | Admit: 2016-09-10 | Discharge: 2016-09-17 | DRG: 184 | Disposition: A | Discharge status: Home Health | Payer: BLUE CROSS/BLUE SHIELD | Attending: Surgery | Admitting: Surgery

## 2016-09-10 DIAGNOSIS — Z981 Arthrodesis status: Secondary | ICD-10-CM

## 2016-09-10 DIAGNOSIS — Y92230 Patient room in hospital as the place of occurrence of the external cause: Secondary | ICD-10-CM | POA: Diagnosis not present

## 2016-09-10 DIAGNOSIS — Z79899 Other long term (current) drug therapy: Secondary | ICD-10-CM

## 2016-09-10 DIAGNOSIS — S81812A Laceration without foreign body, left lower leg, initial encounter: Secondary | ICD-10-CM | POA: Diagnosis present

## 2016-09-10 DIAGNOSIS — F419 Anxiety disorder, unspecified: Secondary | ICD-10-CM | POA: Diagnosis present

## 2016-09-10 DIAGNOSIS — S92321A Displaced fracture of second metatarsal bone, right foot, initial encounter for closed fracture: Secondary | ICD-10-CM | POA: Diagnosis present

## 2016-09-10 DIAGNOSIS — S93324A Dislocation of tarsometatarsal joint of right foot, initial encounter: Secondary | ICD-10-CM

## 2016-09-10 DIAGNOSIS — G8929 Other chronic pain: Secondary | ICD-10-CM | POA: Diagnosis present

## 2016-09-10 DIAGNOSIS — D62 Acute posthemorrhagic anemia: Secondary | ICD-10-CM | POA: Diagnosis not present

## 2016-09-10 DIAGNOSIS — R443 Hallucinations, unspecified: Secondary | ICD-10-CM | POA: Diagnosis not present

## 2016-09-10 DIAGNOSIS — R52 Pain, unspecified: Secondary | ICD-10-CM | POA: Diagnosis not present

## 2016-09-10 DIAGNOSIS — R262 Difficulty in walking, not elsewhere classified: Secondary | ICD-10-CM

## 2016-09-10 DIAGNOSIS — T404X5A Adverse effect of other synthetic narcotics, initial encounter: Secondary | ICD-10-CM | POA: Diagnosis not present

## 2016-09-10 DIAGNOSIS — G43909 Migraine, unspecified, not intractable, without status migrainosus: Secondary | ICD-10-CM | POA: Diagnosis present

## 2016-09-10 DIAGNOSIS — M797 Fibromyalgia: Secondary | ICD-10-CM | POA: Diagnosis present

## 2016-09-10 DIAGNOSIS — S0101XA Laceration without foreign body of scalp, initial encounter: Secondary | ICD-10-CM | POA: Diagnosis present

## 2016-09-10 DIAGNOSIS — S92144A Nondisplaced dome fracture of right talus, initial encounter for closed fracture: Secondary | ICD-10-CM | POA: Diagnosis present

## 2016-09-10 DIAGNOSIS — Y92410 Unspecified street and highway as the place of occurrence of the external cause: Secondary | ICD-10-CM

## 2016-09-10 DIAGNOSIS — Z888 Allergy status to other drugs, medicaments and biological substances status: Secondary | ICD-10-CM

## 2016-09-10 DIAGNOSIS — Z9049 Acquired absence of other specified parts of digestive tract: Secondary | ICD-10-CM

## 2016-09-10 DIAGNOSIS — S81811A Laceration without foreign body, right lower leg, initial encounter: Secondary | ICD-10-CM | POA: Diagnosis present

## 2016-09-10 DIAGNOSIS — D649 Anemia, unspecified: Secondary | ICD-10-CM | POA: Diagnosis present

## 2016-09-10 DIAGNOSIS — S2220XA Unspecified fracture of sternum, initial encounter for closed fracture: Secondary | ICD-10-CM | POA: Diagnosis present

## 2016-09-10 DIAGNOSIS — S2249XA Multiple fractures of ribs, unspecified side, initial encounter for closed fracture: Secondary | ICD-10-CM

## 2016-09-10 DIAGNOSIS — S92001A Unspecified fracture of right calcaneus, initial encounter for closed fracture: Secondary | ICD-10-CM | POA: Diagnosis present

## 2016-09-10 DIAGNOSIS — F319 Bipolar disorder, unspecified: Secondary | ICD-10-CM | POA: Diagnosis present

## 2016-09-10 DIAGNOSIS — S92901A Unspecified fracture of right foot, initial encounter for closed fracture: Secondary | ICD-10-CM | POA: Diagnosis present

## 2016-09-10 DIAGNOSIS — S2222XA Fracture of body of sternum, initial encounter for closed fracture: Secondary | ICD-10-CM

## 2016-09-10 DIAGNOSIS — I7771 Dissection of carotid artery: Secondary | ICD-10-CM

## 2016-09-10 DIAGNOSIS — S2241XA Multiple fractures of ribs, right side, initial encounter for closed fracture: Secondary | ICD-10-CM | POA: Diagnosis present

## 2016-09-10 DIAGNOSIS — S92334A Nondisplaced fracture of third metatarsal bone, right foot, initial encounter for closed fracture: Secondary | ICD-10-CM | POA: Diagnosis present

## 2016-09-10 DIAGNOSIS — S81012A Laceration without foreign body, left knee, initial encounter: Secondary | ICD-10-CM

## 2016-09-10 DIAGNOSIS — F17210 Nicotine dependence, cigarettes, uncomplicated: Secondary | ICD-10-CM | POA: Diagnosis present

## 2016-09-10 DIAGNOSIS — Z79891 Long term (current) use of opiate analgesic: Secondary | ICD-10-CM

## 2016-09-10 HISTORY — DX: Headache, unspecified: R51.9

## 2016-09-10 HISTORY — DX: Gastro-esophageal reflux disease without esophagitis: K21.9

## 2016-09-10 HISTORY — DX: Other chronic pain: G89.29

## 2016-09-10 HISTORY — DX: Headache: R51

## 2016-09-10 HISTORY — DX: Low back pain: M54.5

## 2016-09-10 HISTORY — DX: Migraine, unspecified, not intractable, without status migrainosus: G43.909

## 2016-09-10 HISTORY — DX: Low back pain, unspecified: M54.50

## 2016-09-10 HISTORY — DX: Personal history of other medical treatment: Z92.89

## 2016-09-10 LAB — URINE MICROSCOPIC-ADD ON

## 2016-09-10 LAB — RAPID URINE DRUG SCREEN, HOSP PERFORMED
Amphetamines: NOT DETECTED
BARBITURATES: NOT DETECTED
BENZODIAZEPINES: POSITIVE — AB
COCAINE: NOT DETECTED
OPIATES: POSITIVE — AB
TETRAHYDROCANNABINOL: NOT DETECTED

## 2016-09-10 LAB — CBC
HCT: 30.5 % — ABNORMAL LOW (ref 36.0–46.0)
Hemoglobin: 9.5 g/dL — ABNORMAL LOW (ref 12.0–15.0)
MCH: 23.2 pg — ABNORMAL LOW (ref 26.0–34.0)
MCHC: 31.1 g/dL (ref 30.0–36.0)
MCV: 74.6 fL — ABNORMAL LOW (ref 78.0–100.0)
PLATELETS: 316 10*3/uL (ref 150–400)
RBC: 4.09 MIL/uL (ref 3.87–5.11)
RDW: 20.8 % — AB (ref 11.5–15.5)
WBC: 12.1 10*3/uL — AB (ref 4.0–10.5)

## 2016-09-10 LAB — I-STAT CHEM 8, ED
BUN: 11 mg/dL (ref 6–20)
CALCIUM ION: 1.11 mmol/L — AB (ref 1.15–1.40)
CREATININE: 0.8 mg/dL (ref 0.44–1.00)
Chloride: 105 mmol/L (ref 101–111)
GLUCOSE: 100 mg/dL — AB (ref 65–99)
HCT: 32 % — ABNORMAL LOW (ref 36.0–46.0)
HEMOGLOBIN: 10.9 g/dL — AB (ref 12.0–15.0)
Potassium: 3.7 mmol/L (ref 3.5–5.1)
Sodium: 140 mmol/L (ref 135–145)
TCO2: 25 mmol/L (ref 0–100)

## 2016-09-10 LAB — COMPREHENSIVE METABOLIC PANEL
ALBUMIN: 3.7 g/dL (ref 3.5–5.0)
ALK PHOS: 52 U/L (ref 38–126)
ALT: 19 U/L (ref 14–54)
AST: 45 U/L — AB (ref 15–41)
Anion gap: 6 (ref 5–15)
BILIRUBIN TOTAL: 0.5 mg/dL (ref 0.3–1.2)
BUN: 9 mg/dL (ref 6–20)
CALCIUM: 8.5 mg/dL — AB (ref 8.9–10.3)
CO2: 24 mmol/L (ref 22–32)
CREATININE: 0.85 mg/dL (ref 0.44–1.00)
Chloride: 108 mmol/L (ref 101–111)
GFR calc Af Amer: >60 mL/min (ref >60)
GLUCOSE: 103 mg/dL — AB (ref 65–99)
POTASSIUM: 3.8 mmol/L (ref 3.5–5.1)
Sodium: 138 mmol/L (ref 135–145)
TOTAL PROTEIN: 5.9 g/dL — AB (ref 6.5–8.1)

## 2016-09-10 LAB — URINALYSIS, ROUTINE W REFLEX MICROSCOPIC
BILIRUBIN URINE: NEGATIVE
Glucose, UA: NEGATIVE mg/dL
KETONES UR: NEGATIVE mg/dL
Leukocytes, UA: NEGATIVE
NITRITE: POSITIVE — AB
Protein, ur: NEGATIVE mg/dL
pH: 6.5 (ref 5.0–8.0)

## 2016-09-10 LAB — I-STAT BETA HCG BLOOD, ED (MC, WL, AP ONLY): I-stat hCG, quantitative: <5 m[IU]/mL (ref <5)

## 2016-09-10 LAB — CDS SEROLOGY

## 2016-09-10 LAB — I-STAT CG4 LACTIC ACID, ED: LACTIC ACID, VENOUS: 1.43 mmol/L (ref 0.5–1.9)

## 2016-09-10 LAB — ETHANOL

## 2016-09-10 LAB — PROTIME-INR
INR: 1.04
PROTHROMBIN TIME: 13.6 s (ref 11.4–15.2)

## 2016-09-10 LAB — SAMPLE TO BLOOD BANK
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
## 2016-09-10 MED ORDER — FENTANYL CITRATE (PF) 100 MCG/2ML IJ SOLN
50.0000 ug | Freq: Once | INTRAMUSCULAR | Status: AC
Start: 2016-09-10 — End: 2016-09-11
  Administered 2016-09-11: 50 ug via INTRAVENOUS
  Filled 2016-09-10: qty 2

## 2016-09-10 MED ORDER — HYDROMORPHONE HCL 2 MG/ML IJ SOLN
1.0000 mg | Freq: Once | INTRAMUSCULAR | Status: AC
  Administered 2016-09-10: 1 mg via INTRAVENOUS
  Filled 2016-09-10: qty 1

## 2016-09-10 MED ORDER — IOPAMIDOL (ISOVUE-300) INJECTION 61%
INTRAVENOUS | Status: AC
  Administered 2016-09-10: 100 mL
  Filled 2016-09-10: qty 100

## 2016-09-10 MED ORDER — LIDOCAINE-EPINEPHRINE (PF) 2 %-1:200000 IJ SOLN
20.0000 mL | Freq: Once | INTRAMUSCULAR | Status: AC
  Administered 2016-09-11: 20 mL via INTRADERMAL
  Filled 2016-09-10: qty 20

## 2016-09-10 MED ORDER — ONDANSETRON HCL 4 MG/2ML IJ SOLN
4.0000 mg | Freq: Once | INTRAMUSCULAR | Status: AC
  Administered 2016-09-11: 4 mg via INTRAVENOUS
  Filled 2016-09-10: qty 2

## 2016-09-10 NOTE — ED Notes (Signed)
Pt in radiology 

## 2016-09-10 NOTE — ED Notes (Signed)
Pt returned from radiology.
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
## 2016-09-10 NOTE — ED Notes (Signed)
Pt disconnected self from the cardiac monitor; was found at the foot of the bed, standing up, requesting to walk to the bathroom. Pt is unable to bear weight on foot without pain, appears more confused to place and situation. Pt assisted to restroom by wheelchair. C-collar in place, although pt requesting to remove collar
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
## 2016-09-11 ENCOUNTER — Inpatient Hospital Stay (HOSPITAL_COMMUNITY): Payer: BLUE CROSS/BLUE SHIELD

## 2016-09-11 ENCOUNTER — Encounter (HOSPITAL_COMMUNITY): Payer: Self-pay | Admitting: General Practice

## 2016-09-11 DIAGNOSIS — S81811A Laceration without foreign body, right lower leg, initial encounter: Secondary | ICD-10-CM | POA: Diagnosis present

## 2016-09-11 DIAGNOSIS — Z981 Arthrodesis status: Secondary | ICD-10-CM | POA: Diagnosis not present

## 2016-09-11 DIAGNOSIS — S92334A Nondisplaced fracture of third metatarsal bone, right foot, initial encounter for closed fracture: Secondary | ICD-10-CM | POA: Diagnosis present

## 2016-09-11 DIAGNOSIS — S2249XA Multiple fractures of ribs, unspecified side, initial encounter for closed fracture: Secondary | ICD-10-CM | POA: Diagnosis not present

## 2016-09-11 DIAGNOSIS — Z9049 Acquired absence of other specified parts of digestive tract: Secondary | ICD-10-CM | POA: Diagnosis not present

## 2016-09-11 DIAGNOSIS — S0101XA Laceration without foreign body of scalp, initial encounter: Secondary | ICD-10-CM | POA: Diagnosis present

## 2016-09-11 DIAGNOSIS — F419 Anxiety disorder, unspecified: Secondary | ICD-10-CM | POA: Diagnosis present

## 2016-09-11 DIAGNOSIS — Z79899 Other long term (current) drug therapy: Secondary | ICD-10-CM | POA: Diagnosis not present

## 2016-09-11 DIAGNOSIS — Y92230 Patient room in hospital as the place of occurrence of the external cause: Secondary | ICD-10-CM | POA: Diagnosis not present

## 2016-09-11 DIAGNOSIS — R52 Pain, unspecified: Secondary | ICD-10-CM | POA: Diagnosis present

## 2016-09-11 DIAGNOSIS — S93324A Dislocation of tarsometatarsal joint of right foot, initial encounter: Secondary | ICD-10-CM | POA: Diagnosis not present

## 2016-09-11 DIAGNOSIS — R443 Hallucinations, unspecified: Secondary | ICD-10-CM | POA: Diagnosis not present

## 2016-09-11 DIAGNOSIS — F17210 Nicotine dependence, cigarettes, uncomplicated: Secondary | ICD-10-CM | POA: Diagnosis present

## 2016-09-11 DIAGNOSIS — T404X5A Adverse effect of other synthetic narcotics, initial encounter: Secondary | ICD-10-CM | POA: Diagnosis not present

## 2016-09-11 DIAGNOSIS — S2220XA Unspecified fracture of sternum, initial encounter for closed fracture: Secondary | ICD-10-CM | POA: Diagnosis present

## 2016-09-11 DIAGNOSIS — G8929 Other chronic pain: Secondary | ICD-10-CM | POA: Diagnosis present

## 2016-09-11 DIAGNOSIS — G43909 Migraine, unspecified, not intractable, without status migrainosus: Secondary | ICD-10-CM | POA: Diagnosis present

## 2016-09-11 DIAGNOSIS — Y92410 Unspecified street and highway as the place of occurrence of the external cause: Secondary | ICD-10-CM | POA: Diagnosis not present

## 2016-09-11 DIAGNOSIS — F319 Bipolar disorder, unspecified: Secondary | ICD-10-CM | POA: Diagnosis present

## 2016-09-11 DIAGNOSIS — I7771 Dissection of carotid artery: Secondary | ICD-10-CM | POA: Diagnosis not present

## 2016-09-11 DIAGNOSIS — S92144A Nondisplaced dome fracture of right talus, initial encounter for closed fracture: Secondary | ICD-10-CM | POA: Diagnosis present

## 2016-09-11 DIAGNOSIS — M797 Fibromyalgia: Secondary | ICD-10-CM | POA: Diagnosis present

## 2016-09-11 DIAGNOSIS — S81812A Laceration without foreign body, left lower leg, initial encounter: Secondary | ICD-10-CM | POA: Diagnosis present

## 2016-09-11 DIAGNOSIS — D62 Acute posthemorrhagic anemia: Secondary | ICD-10-CM | POA: Diagnosis not present

## 2016-09-11 DIAGNOSIS — Z79891 Long term (current) use of opiate analgesic: Secondary | ICD-10-CM | POA: Diagnosis not present

## 2016-09-11 DIAGNOSIS — R410 Disorientation, unspecified: Secondary | ICD-10-CM | POA: Diagnosis not present

## 2016-09-11 DIAGNOSIS — S92001A Unspecified fracture of right calcaneus, initial encounter for closed fracture: Secondary | ICD-10-CM | POA: Diagnosis present

## 2016-09-11 DIAGNOSIS — D649 Anemia, unspecified: Secondary | ICD-10-CM | POA: Diagnosis present

## 2016-09-11 DIAGNOSIS — S92321A Displaced fracture of second metatarsal bone, right foot, initial encounter for closed fracture: Secondary | ICD-10-CM | POA: Diagnosis present

## 2016-09-11 DIAGNOSIS — S92901A Unspecified fracture of right foot, initial encounter for closed fracture: Secondary | ICD-10-CM | POA: Diagnosis present

## 2016-09-11 DIAGNOSIS — S2222XA Fracture of body of sternum, initial encounter for closed fracture: Secondary | ICD-10-CM | POA: Diagnosis present

## 2016-09-11 DIAGNOSIS — S2241XA Multiple fractures of ribs, right side, initial encounter for closed fracture: Secondary | ICD-10-CM | POA: Diagnosis present

## 2016-09-11 DIAGNOSIS — S81012A Laceration without foreign body, left knee, initial encounter: Secondary | ICD-10-CM | POA: Diagnosis not present

## 2016-09-11 DIAGNOSIS — R262 Difficulty in walking, not elsewhere classified: Secondary | ICD-10-CM | POA: Diagnosis not present

## 2016-09-11 DIAGNOSIS — Z888 Allergy status to other drugs, medicaments and biological substances status: Secondary | ICD-10-CM | POA: Diagnosis not present

## 2016-09-11 LAB — BASIC METABOLIC PANEL
Anion gap: 6 (ref 5–15)
BUN: 7 mg/dL (ref 6–20)
CALCIUM: 8.3 mg/dL — AB (ref 8.9–10.3)
CHLORIDE: 107 mmol/L (ref 101–111)
CO2: 25 mmol/L (ref 22–32)
CREATININE: 0.7 mg/dL (ref 0.44–1.00)
GFR calc non Af Amer: >60 mL/min (ref >60)
Glucose, Bld: 115 mg/dL — ABNORMAL HIGH (ref 65–99)
Potassium: 3.2 mmol/L — ABNORMAL LOW (ref 3.5–5.1)
SODIUM: 138 mmol/L (ref 135–145)

## 2016-09-11 LAB — CBC
HCT: 26.4 % — ABNORMAL LOW (ref 36.0–46.0)
HEMOGLOBIN: 8.1 g/dL — AB (ref 12.0–15.0)
MCH: 22.8 pg — ABNORMAL LOW (ref 26.0–34.0)
MCHC: 30.7 g/dL (ref 30.0–36.0)
MCV: 74.4 fL — ABNORMAL LOW (ref 78.0–100.0)
Platelets: 230 10*3/uL (ref 150–400)
RBC: 3.55 MIL/uL — ABNORMAL LOW (ref 3.87–5.11)
RDW: 20.3 % — ABNORMAL HIGH (ref 11.5–15.5)
WBC: 7.5 10*3/uL (ref 4.0–10.5)

## 2016-09-11 LAB — HIV ANTIBODY (ROUTINE TESTING W REFLEX): HIV SCREEN 4TH GENERATION: NONREACTIVE

## 2016-09-11 MED ORDER — DIPHENHYDRAMINE HCL 12.5 MG/5ML PO ELIX: 12.5000 mg | ORAL_SOLUTION | Freq: Four times a day (QID) | ORAL | Status: DC | PRN

## 2016-09-11 MED ORDER — BACITRACIN ZINC 500 UNIT/GM EX OINT
TOPICAL_OINTMENT | Freq: Two times a day (BID) | CUTANEOUS | Status: DC
  Administered 2016-09-11 – 2016-09-17 (×13): via TOPICAL
  Filled 2016-09-11 (×3): qty 28.35

## 2016-09-11 MED ORDER — ONDANSETRON HCL 4 MG/2ML IJ SOLN: 4.0000 mg | Freq: Four times a day (QID) | INTRAMUSCULAR | Status: DC | PRN

## 2016-09-11 MED ORDER — OXYCODONE HCL 5 MG PO TABS: 5.0000 mg | ORAL_TABLET | ORAL | Status: DC | PRN

## 2016-09-11 MED ORDER — DOCUSATE SODIUM 100 MG PO CAPS
100.0000 mg | ORAL_CAPSULE | Freq: Two times a day (BID) | ORAL | Status: DC
  Administered 2016-09-12 – 2016-09-17 (×11): 100 mg via ORAL
  Filled 2016-09-11 (×12): qty 1

## 2016-09-11 MED ORDER — NALOXONE HCL 0.4 MG/ML IJ SOLN: 0.4000 mg | INTRAMUSCULAR | Status: DC | PRN

## 2016-09-11 MED ORDER — ENSURE ENLIVE PO LIQD
237.0000 mL | Freq: Two times a day (BID) | ORAL | Status: DC
  Administered 2016-09-11 – 2016-09-12 (×2): 237 mL via ORAL

## 2016-09-11 MED ORDER — POLYETHYLENE GLYCOL 3350 17 G PO PACK
17.0000 g | PACK | Freq: Every day | ORAL | Status: DC
  Administered 2016-09-13 – 2016-09-17 (×4): 17 g via ORAL
  Filled 2016-09-11 (×5): qty 1

## 2016-09-11 MED ORDER — MORPHINE SULFATE 2 MG/ML IV SOLN
INTRAVENOUS | Status: DC
  Administered 2016-09-11: 03:00:00 via INTRAVENOUS
  Administered 2016-09-11: 9 mg via INTRAVENOUS
  Filled 2016-09-11: qty 25

## 2016-09-11 MED ORDER — PREGABALIN 75 MG PO CAPS
225.0000 mg | ORAL_CAPSULE | Freq: Three times a day (TID) | ORAL | Status: DC
  Administered 2016-09-11 – 2016-09-17 (×20): 225 mg via ORAL
  Filled 2016-09-11 (×20): qty 3

## 2016-09-11 MED ORDER — SODIUM CHLORIDE 0.9% FLUSH: 9.0000 mL | INTRAVENOUS | Status: DC | PRN

## 2016-09-11 MED ORDER — ENOXAPARIN SODIUM 30 MG/0.3ML ~~LOC~~ SOLN
30.0000 mg | SUBCUTANEOUS | Status: DC
Start: 2016-09-11 — End: 2016-09-12

## 2016-09-11 MED ORDER — HYDROMORPHONE HCL 2 MG/ML IJ SOLN
0.5000 mg | INTRAMUSCULAR | Status: DC | PRN
  Administered 2016-09-13 – 2016-09-14 (×2): 0.5 mg via INTRAVENOUS
  Filled 2016-09-11 (×2): qty 1

## 2016-09-11 MED ORDER — SERTRALINE HCL 50 MG PO TABS
50.0000 mg | ORAL_TABLET | Freq: Every day | ORAL | Status: DC
  Administered 2016-09-11 – 2016-09-17 (×6): 50 mg via ORAL
  Filled 2016-09-11 (×7): qty 1

## 2016-09-11 MED ORDER — HYDROMORPHONE HCL 2 MG/ML IJ SOLN: 1.0000 mg | INTRAMUSCULAR | Status: DC | PRN

## 2016-09-11 MED ORDER — POTASSIUM CHLORIDE IN NACL 20-0.9 MEQ/L-% IV SOLN
INTRAVENOUS | Status: DC
  Administered 2016-09-11: 03:00:00 via INTRAVENOUS
  Filled 2016-09-11: qty 1000

## 2016-09-11 MED ORDER — TIZANIDINE HCL 2 MG PO TABS
8.0000 mg | ORAL_TABLET | Freq: Every day | ORAL | Status: DC
  Administered 2016-09-11 – 2016-09-14 (×4): 8 mg via ORAL
  Filled 2016-09-11 (×4): qty 4

## 2016-09-11 MED ORDER — DIPHENHYDRAMINE HCL 50 MG/ML IJ SOLN: 12.5000 mg | Freq: Four times a day (QID) | INTRAMUSCULAR | Status: DC | PRN

## 2016-09-11 MED ORDER — QUETIAPINE FUMARATE 50 MG PO TABS
50.0000 mg | ORAL_TABLET | Freq: Every day | ORAL | Status: DC
  Administered 2016-09-13 – 2016-09-14 (×2): 50 mg via ORAL
  Filled 2016-09-11 (×4): qty 1

## 2016-09-11 MED ORDER — OXYCODONE HCL 5 MG PO TABS: 2.5000 mg | ORAL_TABLET | ORAL | Status: DC | PRN

## 2016-09-11 MED ORDER — MORPHINE SULFATE ER 15 MG PO TBCR
15.0000 mg | EXTENDED_RELEASE_TABLET | Freq: Two times a day (BID) | ORAL | Status: DC
  Administered 2016-09-11 (×2): 15 mg via ORAL
  Filled 2016-09-11 (×2): qty 1

## 2016-09-11 MED ORDER — DIAZEPAM 5 MG PO TABS
10.0000 mg | ORAL_TABLET | Freq: Three times a day (TID) | ORAL | Status: DC | PRN
  Administered 2016-09-11 (×3): 10 mg via ORAL
  Filled 2016-09-11 (×3): qty 2

## 2016-09-11 MED ORDER — OXYCODONE HCL 5 MG PO TABS
10.0000 mg | ORAL_TABLET | ORAL | Status: DC | PRN
  Administered 2016-09-11 (×3): 10 mg via ORAL
  Filled 2016-09-11 (×4): qty 2

## 2016-09-11 MED ORDER — OXYCODONE HCL 5 MG PO TABS: 10.0000 mg | ORAL_TABLET | ORAL | Status: DC | PRN

## 2016-09-11 MED ORDER — ONDANSETRON HCL 4 MG PO TABS: 4.0000 mg | ORAL_TABLET | Freq: Four times a day (QID) | ORAL | Status: DC | PRN

## 2016-09-11 NOTE — Progress Notes (Signed)
Orthopedic Tech Progress Note Patient Details:  Leonie GreenMaria Krakowski 1976-01-09 [id number]  Ortho Devices Type of Ortho Device: Post (short leg) splint Ortho Device/Splint Location: rle Ortho Device/Splint Interventions: Ordered, Application   Trinna PostMartinez, Bilbo Carcamo J 09/11/2016, 2:09 AM

## 2016-09-11 NOTE — ED Provider Notes (Signed)
LACERATION REPAIR Performed by: [name],[name] Authorized by: [name],[name] Consent: Verbal consent obtained. Risks and benefits: risks, benefits and alternatives were discussed Consent given by: patient Patient identity confirmed: provided demographic data Prepped and Draped in normal sterile fashion Wound explored  Laceration Location: right tib fib  Laceration Length: 2 cm  No Foreign Bodies seen or palpated  Anesthesia: local infiltration  Local anesthetic: lidocaine 2% w epinephrine  Anesthetic total: 1 ml  Irrigation method: syringe Amount of cleaning: standard  Skin closure: 3'0 prolene  Number of sutures: 2  Technique: simple interrupted.  Patient tolerance: Patient tolerated the procedure well with no immediate complications.   LACERATION REPAIR Performed by: [name],[name] Authorized by: [name],[name] Consent: Verbal consent obtained. Risks and benefits: risks, benefits and alternatives were discussed Consent given by: patient Patient identity confirmed: provided demographic data Prepped and Draped in normal sterile fashion Wound explored  Laceration Location: left tib/fib  Laceration Length: 2cm  No Foreign Bodies seen or palpated  Anesthesia: local infiltration  Local anesthetic: lidocaine 2% with epinephrine  Anesthetic total: 2 ml  Irrigation method: syringe Amount of cleaning: standard  Skin closure: 3'0 prolene  Number of sutures: 4  Technique: simple interrupted  Patient tolerance: Patient tolerated the procedure well with no immediate complications.  Please refer to Dr. [name] chart for patient visit.    Sherri Peliffany Tavaras Goody, PA-C 09/11/16 [phone]    Sherri LovelessScott Goldston, MD 09/13/16 [phone]

## 2016-09-11 NOTE — Progress Notes (Signed)
Unable to complete health history upon patient's arrival to floor due to patient's confusion.
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
## 2016-09-12 DIAGNOSIS — S81811A Laceration without foreign body, right lower leg, initial encounter: Secondary | ICD-10-CM

## 2016-09-12 DIAGNOSIS — I7771 Dissection of carotid artery: Secondary | ICD-10-CM

## 2016-09-12 DIAGNOSIS — S2222XA Fracture of body of sternum, initial encounter for closed fracture: Principal | ICD-10-CM

## 2016-09-12 DIAGNOSIS — S81012A Laceration without foreign body, left knee, initial encounter: Secondary | ICD-10-CM

## 2016-09-12 DIAGNOSIS — R262 Difficulty in walking, not elsewhere classified: Secondary | ICD-10-CM

## 2016-09-12 DIAGNOSIS — S93324A Dislocation of tarsometatarsal joint of right foot, initial encounter: Secondary | ICD-10-CM

## 2016-09-12 DIAGNOSIS — S0101XA Laceration without foreign body of scalp, initial encounter: Secondary | ICD-10-CM

## 2016-09-12 DIAGNOSIS — S2249XA Multiple fractures of ribs, unspecified side, initial encounter for closed fracture: Secondary | ICD-10-CM

## 2016-09-12 DIAGNOSIS — S2241XA Multiple fractures of ribs, right side, initial encounter for closed fracture: Secondary | ICD-10-CM

## 2016-09-12 LAB — COMPREHENSIVE METABOLIC PANEL
ALBUMIN: 2.7 g/dL — AB (ref 3.5–5.0)
ALT: 19 U/L (ref 14–54)
AST: 42 U/L — AB (ref 15–41)
Alkaline Phosphatase: 40 U/L (ref 38–126)
Anion gap: 7 (ref 5–15)
BILIRUBIN TOTAL: 0.4 mg/dL (ref 0.3–1.2)
BUN: 6 mg/dL (ref 6–20)
CALCIUM: 7.9 mg/dL — AB (ref 8.9–10.3)
CHLORIDE: 107 mmol/L (ref 101–111)
CO2: 23 mmol/L (ref 22–32)
CREATININE: 0.76 mg/dL (ref 0.44–1.00)
GFR calc Af Amer: >60 mL/min (ref >60)
GFR calc non Af Amer: >60 mL/min (ref >60)
GLUCOSE: 113 mg/dL — AB (ref 65–99)
POTASSIUM: 3.3 mmol/L — AB (ref 3.5–5.1)
Sodium: 137 mmol/L (ref 135–145)
TOTAL PROTEIN: 4.4 g/dL — AB (ref 6.5–8.1)

## 2016-09-12 LAB — CBC
HEMATOCRIT: 19.9 % — AB (ref 36.0–46.0)
Hemoglobin: 6.2 g/dL — CL (ref 12.0–15.0)
MCH: 23.8 pg — ABNORMAL LOW (ref 26.0–34.0)
MCHC: 31.2 g/dL (ref 30.0–36.0)
MCV: 76.2 fL — ABNORMAL LOW (ref 78.0–100.0)
PLATELETS: 183 10*3/uL (ref 150–400)
RBC: 2.61 MIL/uL — AB (ref 3.87–5.11)
RDW: 20.8 % — AB (ref 11.5–15.5)
WBC: 7.1 10*3/uL (ref 4.0–10.5)

## 2016-09-12 LAB — PREPARE RBC (CROSSMATCH)

## 2016-09-12 MED ORDER — SODIUM CHLORIDE 0.9 % IV SOLN
Freq: Once | INTRAVENOUS | Status: AC
  Administered 2016-09-12: 11:00:00 via INTRAVENOUS

## 2016-09-12 MED ORDER — SODIUM CHLORIDE 0.9 % IV BOLUS (SEPSIS)
1000.0000 mL | Freq: Once | INTRAVENOUS | Status: AC
  Administered 2016-09-12: 1000 mL via INTRAVENOUS

## 2016-09-12 MED ORDER — NALOXONE HCL 0.4 MG/ML IJ SOLN
0.4000 mg | INTRAMUSCULAR | Status: DC | PRN
  Administered 2016-09-12: 0.4 mg via INTRAVENOUS

## 2016-09-12 MED ORDER — NALOXONE HCL 0.4 MG/ML IJ SOLN
INTRAMUSCULAR | Status: AC
  Administered 2016-09-12: 04:00:00
  Filled 2016-09-12: qty 1

## 2016-09-12 MED ORDER — BUPROPION HCL ER (XL) 150 MG PO TB24
300.0000 mg | ORAL_TABLET | ORAL | Status: DC
  Administered 2016-09-12 – 2016-09-17 (×6): 300 mg via ORAL
  Filled 2016-09-12 (×6): qty 2

## 2016-09-12 MED ORDER — OXYCODONE HCL 5 MG PO TABS
10.0000 mg | ORAL_TABLET | ORAL | Status: DC | PRN
Start: 2016-09-12 — End: 2016-09-12
  Administered 2016-09-12: 10 mg via ORAL
  Filled 2016-09-12: qty 2

## 2016-09-12 MED ORDER — KETOROLAC TROMETHAMINE 15 MG/ML IJ SOLN
15.0000 mg | Freq: Four times a day (QID) | INTRAMUSCULAR | Status: DC | PRN
  Administered 2016-09-12: 15 mg via INTRAVENOUS
  Filled 2016-09-12: qty 1

## 2016-09-12 MED ORDER — TRAMADOL HCL 50 MG PO TABS
100.0000 mg | ORAL_TABLET | Freq: Four times a day (QID) | ORAL | Status: DC
  Administered 2016-09-12 – 2016-09-15 (×13): 100 mg via ORAL
  Filled 2016-09-12 (×13): qty 2

## 2016-09-12 MED ORDER — DIAZEPAM 5 MG PO TABS
10.0000 mg | ORAL_TABLET | Freq: Three times a day (TID) | ORAL | Status: DC | PRN
  Administered 2016-09-12 – 2016-09-17 (×9): 10 mg via ORAL
  Filled 2016-09-12 (×9): qty 2

## 2016-09-12 MED ORDER — OXYCODONE HCL 5 MG PO TABS
5.0000 mg | ORAL_TABLET | ORAL | Status: DC | PRN
  Administered 2016-09-12 – 2016-09-13 (×3): 10 mg via ORAL
  Administered 2016-09-13 (×2): 15 mg via ORAL
  Administered 2016-09-13: 10 mg via ORAL
  Administered 2016-09-13 – 2016-09-16 (×10): 15 mg via ORAL
  Filled 2016-09-12: qty 3
  Filled 2016-09-12: qty 2
  Filled 2016-09-12 (×4): qty 3
  Filled 2016-09-12: qty 2
  Filled 2016-09-12 (×4): qty 3
  Filled 2016-09-12: qty 2
  Filled 2016-09-12 (×3): qty 3
  Filled 2016-09-12: qty 2

## 2016-09-12 NOTE — Progress Notes (Signed)
Met with pt to discuss home arrangements.  Pt more alert and able to converse with case manager, though states is having pain.  Pt states she has her "soon to be ex-husband" and children at home, ages 56, 21 and 48.  Parents and siblings also live nearby who she states can also assist.  PT recommending [facility] follow up; OT here to work with pt now.  Notified bedside nurse that pt is requesting pain meds.  Will follow for discharge planning, therapy recommendations.    Reinaldo Raddle, RN, BSN  Trauma/Neuro ICU Case Manager [phone]

## 2016-09-12 NOTE — Progress Notes (Addendum)
PT Cancellation Note  Patient Details Name: Sherri GreenMaria Delapaz MRN: [id number] DOB: Jun 01, 1976   Cancelled Treatment:    Reason Eval/Treat Not Completed: Medical issues which prohibited therapy. Pt's Hgb 6.2 and she is very sleepy and appears lethargic.  Pt is scheduled to receive blood.  Will check back as schedule permits.   ADDENDUM: Right after this note was written, pt found standing outside of bed by nursing with confusion, so PT entered and worked with pt to educate on WB status etc. Please see separate PT evaluation.  Loryn Haacke LUBECK 09/12/2016, 9:50 AM
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
## 2016-09-12 NOTE — Progress Notes (Signed)
OT Cancellation Note  Patient Details Name: Sherri GreenMaria Cargo MRN: [id number] DOB: [birth year]   Cancelled Treatment:    Reason Eval/Treat Not Completed: Medical issues which prohibited therapy. Pt with HgB 6.2 and increased lethargy. Pt scheduled to receive blood. Will check back as able.  [id number] Bow Ridge DriveCharity A Marilea Gwynne, OTR/L [phone] 09/12/2016, 10:07 AM

## 2016-09-12 NOTE — Progress Notes (Signed)
0230 Pt. called for ass't. to BR and while up pt. became listless, difficult to arouse with SBP 61. Called staff member to ass't. back to bed, 2nd staff brought Narcan which was given, RRNurse called and at bedside. MD on call paged, orders received and carried out. Pt. responded to Narcan x1 dose. Frequent VS charted on flowsheet. Pt. is awake, alert, oriented and tearful with increase pain. Toradol 15mg . IV given as ordered.
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
## 2016-09-12 NOTE — Significant Event (Signed)
Rapid Response Event Note Pt unresponsive Overview: Time Called: 0256 Arrival Time: 0256 Event Type: Other (Comment), Neurologic (unresponsive)  Initial Focused Assessment: [facility], unresponsive, BP 99/59, HR 80, RR 14, 100% 2L. Narcan 0.4mg  IVP given PTA. Pt aroused to tactile stimulation, pt alert to self and place only. Disoriented to time. Skin warm and dry  Interventions: 500 cc bolus given , O2 applied, Narcan 0.4 mg IVP. BP 99/59, HR 92, RR 18, 100% RA. Answering questions appropriate. Dr. Dwain SarnaWakefield contacted, new orders completed  for cbc, cmet, additional 500 cc bolus and to discontinue narcotics.  Plan of Care (if not transferred): Monitor pt, call for as needed Isaiah SergeSondra RN   Event Summary: Name of Physician Notified: Dr. Dwain Sarnawakefield  at [phone]    at    Outcome: Stayed in room and stabalized     MilburnSHULAR, Charron Coultas Rose Hill AcresPaige
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
## 2016-09-13 LAB — TYPE AND SCREEN
ABO/RH(D): A POS
ANTIBODY SCREEN: NEGATIVE
Unit division: 0

## 2016-09-13 LAB — CBC
HEMATOCRIT: 27.2 % — AB (ref 36.0–46.0)
HEMOGLOBIN: 8.5 g/dL — AB (ref 12.0–15.0)
MCH: 23.8 pg — ABNORMAL LOW (ref 26.0–34.0)
MCHC: 31.3 g/dL (ref 30.0–36.0)
MCV: 76.2 fL — AB (ref 78.0–100.0)
Platelets: 248 10*3/uL (ref 150–400)
RBC: 3.57 MIL/uL — ABNORMAL LOW (ref 3.87–5.11)
RDW: 19.6 % — AB (ref 11.5–15.5)
WBC: 8.6 10*3/uL (ref 4.0–10.5)

## 2016-09-13 MED ORDER — ENOXAPARIN SODIUM 40 MG/0.4ML ~~LOC~~ SOLN
40.0000 mg | SUBCUTANEOUS | Status: DC
  Administered 2016-09-13 – 2016-09-17 (×5): 40 mg via SUBCUTANEOUS
  Filled 2016-09-13 (×5): qty 0.4
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
## 2016-09-14 MED ORDER — HYDROMORPHONE HCL 2 MG/ML IJ SOLN: 1.0000 mg | INTRAMUSCULAR | Status: DC | PRN

## 2016-09-14 MED ORDER — KETOROLAC TROMETHAMINE 30 MG/ML IJ SOLN: 30.0000 mg | Freq: Four times a day (QID) | INTRAMUSCULAR | Status: DC | PRN
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
## 2016-09-14 NOTE — Progress Notes (Signed)
Page placed to DR. Cornett. Pt not relieved by prn pain meds.

## 2016-09-15 ENCOUNTER — Encounter (HOSPITAL_COMMUNITY): Payer: Self-pay | Admitting: *Deleted

## 2016-09-15 ENCOUNTER — Inpatient Hospital Stay (HOSPITAL_COMMUNITY): Payer: BLUE CROSS/BLUE SHIELD

## 2016-09-15 ENCOUNTER — Other Ambulatory Visit: Payer: Self-pay

## 2016-09-15 DIAGNOSIS — R410 Disorientation, unspecified: Secondary | ICD-10-CM

## 2016-09-15 LAB — CBC
HCT: 25 % — ABNORMAL LOW (ref 36.0–46.0)
HEMOGLOBIN: 7.9 g/dL — AB (ref 12.0–15.0)
MCH: 24.1 pg — ABNORMAL LOW (ref 26.0–34.0)
MCHC: 31.6 g/dL (ref 30.0–36.0)
MCV: 76.2 fL — ABNORMAL LOW (ref 78.0–100.0)
Platelets: 260 10*3/uL (ref 150–400)
RBC: 3.28 MIL/uL — AB (ref 3.87–5.11)
RDW: 19.9 % — ABNORMAL HIGH (ref 11.5–15.5)
WBC: 5.8 10*3/uL (ref 4.0–10.5)

## 2016-09-15 MED ORDER — QUETIAPINE FUMARATE 50 MG PO TABS
50.0000 mg | ORAL_TABLET | Freq: Two times a day (BID) | ORAL | Status: DC
Start: 2016-09-15 — End: 2016-09-17
  Administered 2016-09-15 – 2016-09-17 (×4): 50 mg via ORAL
  Filled 2016-09-15 (×4): qty 1

## 2016-09-15 MED ORDER — TIZANIDINE HCL 2 MG PO TABS
8.0000 mg | ORAL_TABLET | Freq: Three times a day (TID) | ORAL | Status: DC
  Administered 2016-09-15 – 2016-09-17 (×6): 8 mg via ORAL
  Filled 2016-09-15 (×7): qty 4

## 2016-09-15 MED ORDER — IOPAMIDOL (ISOVUE-370) INJECTION 76%
INTRAVENOUS | Status: AC
  Administered 2016-09-15: 50 mL
  Filled 2016-09-15: qty 100

## 2016-09-15 MED ORDER — HYDROMORPHONE HCL 2 MG/ML IJ SOLN
0.5000 mg | INTRAMUSCULAR | Status: DC | PRN
  Administered 2016-09-15 – 2016-09-16 (×2): 0.5 mg via INTRAVENOUS
  Filled 2016-09-15 (×2): qty 1
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
## 2016-09-15 NOTE — Progress Notes (Signed)
PT Cancellation Note  Patient Details Name: Sherri James MRN: [id number] DOB: [birth year]   Cancelled Treatment:    Reason Eval/Treat Not Completed: Patient at procedure or test/unavailable PT will check on pt later as time allows.    Derek MoundKellyn R Terran Hollenkamp Dineen Conradt, PTA Pager: [phone]   09/15/2016, 2:35 PM
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
## 2016-09-16 LAB — URINE CULTURE

## 2016-09-16 MED ORDER — FOSFOMYCIN TROMETHAMINE 3 G PO PACK
3.0000 g | PACK | ORAL | Status: AC | PRN
  Administered 2016-09-17: 3 g via ORAL
  Filled 2016-09-16: qty 3

## 2016-09-16 MED ORDER — HYDROMORPHONE HCL 2 MG/ML IJ SOLN
0.2000 mg | INTRAMUSCULAR | Status: DC | PRN
  Administered 2016-09-17: 0.2 mg via INTRAVENOUS
  Filled 2016-09-16 (×2): qty 1

## 2016-09-16 MED ORDER — SUMATRIPTAN SUCCINATE 50 MG PO TABS
50.0000 mg | ORAL_TABLET | Freq: Once | ORAL | Status: AC
  Administered 2016-09-16: 50 mg via ORAL
  Filled 2016-09-16: qty 1

## 2016-09-16 MED ORDER — OXYCODONE HCL 5 MG PO TABS
10.0000 mg | ORAL_TABLET | ORAL | Status: DC | PRN
  Administered 2016-09-16: 20 mg via ORAL
  Administered 2016-09-16 – 2016-09-17 (×2): 10 mg via ORAL
  Administered 2016-09-17: 15 mg via ORAL
  Administered 2016-09-17: 20 mg via ORAL
  Filled 2016-09-16: qty 2
  Filled 2016-09-16 (×2): qty 4
  Filled 2016-09-16: qty 3
  Filled 2016-09-16: qty 4

## 2016-09-16 MED ORDER — FOSFOMYCIN TROMETHAMINE 3 G PO PACK
3.0000 g | PACK | ORAL | Status: AC
  Administered 2016-09-16: 3 g via ORAL
  Filled 2016-09-16: qty 3

## 2016-09-16 MED ORDER — CIPROFLOXACIN HCL 500 MG PO TABS
500.0000 mg | ORAL_TABLET | Freq: Two times a day (BID) | ORAL | Status: DC
  Administered 2016-09-16: 500 mg via ORAL
  Filled 2016-09-16: qty 1

## 2016-09-16 NOTE — Progress Notes (Signed)
CTA head and neck obtained and shows no abnormalities or dissections.  Patient no longer having hallucinations. Continue current Seroquel dose. No further recommendations. Neurology S/O  Felicie MornDavid Smith PA-C [facility] [phone]  Call with questions.   M-F  (am- 4 PM)  09/16/2016, 10:02 AM
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
## 2016-09-17 MED ORDER — OXYCODONE-ACETAMINOPHEN 10-325 MG PO TABS: 1.0000 | ORAL_TABLET | ORAL | 0 refills | Status: AC | PRN

## 2016-09-17 MED ORDER — OXYCODONE-ACETAMINOPHEN 10-325 MG PO TABS: 1.0000 | ORAL_TABLET | ORAL | 0 refills | Status: DC | PRN

## 2016-09-17 MED ORDER — ONDANSETRON HCL 4 MG PO TABS: 4.0000 mg | ORAL_TABLET | ORAL | 0 refills | Status: AC | PRN
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
## 2016-09-17 NOTE — Progress Notes (Signed)
Pt discharged home in stable condition. Discharge instructions given with no concerns voiced 

## 2016-09-17 NOTE — Discharge Instructions (Signed)
Do not put weight down on your right leg.  Keep splint clean and dry.  No driving while taking oxycodone or morphine.

## 2016-09-17 NOTE — Care Management Note (Signed)
Case Management Note  Patient Details  Name: Sherri GreenMaria James MRN: [id number] Date of Birth: Mar 12, 1976  Subjective/Objective:   Pt medically stable for discharge home today.  Family to provide assistance at discharge.  PT/OT recommending follow up home therapies, DME.                     Action/Plan: [facility]  [facility] needs.  RW and BSC to be delivered to pt prior to dc home.    Expected Discharge Date:    09/17/16              Expected Discharge Plan:  Home w Home Health Services  In-House Referral:     Discharge planning Services  CM Consult  Post Acute Care Choice:  Home Health Choice offered to:     DME Arranged:  3-N-1, Walker rolling DME Agency:  [facility]  HH Arr[facility]  [facility]  [facility]  Status of Service:  Completed, signed off  If discussed at Long Length of Stay Meetings, dates discussed:    Additional Comments:  Quintella BatonJulie W. Jurline Folger, RN, BSN  Trauma/Neuro ICU Case Manager [phone]
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
## 2016-09-17 NOTE — Progress Notes (Signed)
Pt observed to be in deep sleep and difficult to arouse after taking all morning medications. When she finally was awake enough, she is still reporting pain to left side of neck. Advised to back off some of the pain medications and maybe try alternative therapies such as heat packs as her blood pressure was noted to be in the 80s.

## 2016-10-22 ENCOUNTER — Encounter (HOSPITAL_COMMUNITY): Payer: Self-pay | Admitting: *Deleted

## 2016-10-22 ENCOUNTER — Ambulatory Visit (INDEPENDENT_AMBULATORY_CARE_PROVIDER_SITE_OTHER): Payer: BLUE CROSS/BLUE SHIELD

## 2016-10-22 ENCOUNTER — Ambulatory Visit (HOSPITAL_COMMUNITY)
Admission: EM | Admit: 2016-10-22 | Discharge: 2016-10-22 | Disposition: A | Discharge status: Home / Self Care | Payer: BLUE CROSS/BLUE SHIELD | Attending: Emergency Medicine | Admitting: Emergency Medicine

## 2016-10-22 DIAGNOSIS — D62 Acute posthemorrhagic anemia: Secondary | ICD-10-CM | POA: Insufficient documentation

## 2016-10-22 DIAGNOSIS — S2241XD Multiple fractures of ribs, right side, subsequent encounter for fracture with routine healing: Secondary | ICD-10-CM | POA: Insufficient documentation

## 2016-10-22 DIAGNOSIS — N23 Unspecified renal colic: Secondary | ICD-10-CM | POA: Diagnosis not present

## 2016-10-22 DIAGNOSIS — F419 Anxiety disorder, unspecified: Secondary | ICD-10-CM | POA: Insufficient documentation

## 2016-10-22 DIAGNOSIS — Z79899 Other long term (current) drug therapy: Secondary | ICD-10-CM | POA: Insufficient documentation

## 2016-10-22 DIAGNOSIS — F319 Bipolar disorder, unspecified: Secondary | ICD-10-CM | POA: Insufficient documentation

## 2016-10-22 DIAGNOSIS — Z4802 Encounter for removal of sutures: Secondary | ICD-10-CM

## 2016-10-22 DIAGNOSIS — M797 Fibromyalgia: Secondary | ICD-10-CM | POA: Insufficient documentation

## 2016-10-22 DIAGNOSIS — S92321A Displaced fracture of second metatarsal bone, right foot, initial encounter for closed fracture: Secondary | ICD-10-CM

## 2016-10-22 DIAGNOSIS — S0101XD Laceration without foreign body of scalp, subsequent encounter: Secondary | ICD-10-CM | POA: Insufficient documentation

## 2016-10-22 DIAGNOSIS — S81812D Laceration without foreign body, left lower leg, subsequent encounter: Secondary | ICD-10-CM | POA: Insufficient documentation

## 2016-10-22 DIAGNOSIS — K219 Gastro-esophageal reflux disease without esophagitis: Secondary | ICD-10-CM | POA: Insufficient documentation

## 2016-10-22 DIAGNOSIS — F17210 Nicotine dependence, cigarettes, uncomplicated: Secondary | ICD-10-CM | POA: Insufficient documentation

## 2016-10-22 DIAGNOSIS — S2220XD Unspecified fracture of sternum, subsequent encounter for fracture with routine healing: Secondary | ICD-10-CM | POA: Insufficient documentation

## 2016-10-22 DIAGNOSIS — G8929 Other chronic pain: Secondary | ICD-10-CM | POA: Insufficient documentation

## 2016-10-22 DIAGNOSIS — S81811D Laceration without foreign body, right lower leg, subsequent encounter: Secondary | ICD-10-CM | POA: Insufficient documentation

## 2016-10-22 DIAGNOSIS — Z9889 Other specified postprocedural states: Secondary | ICD-10-CM | POA: Insufficient documentation
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
## 2016-10-22 NOTE — ED Notes (Signed)
Pt  Informed  Over  The  Delay     She  Was  Advised  Dr  Piedad Climeshonig  Was   Formulating a  Plan of  care
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
## 2016-10-24 ENCOUNTER — Ambulatory Visit (INDEPENDENT_AMBULATORY_CARE_PROVIDER_SITE_OTHER): Payer: BLUE CROSS/BLUE SHIELD | Admitting: Orthopedic Surgery

## 2016-10-24 ENCOUNTER — Telehealth: Payer: Self-pay | Admitting: Internal Medicine

## 2016-10-24 LAB — URINE CULTURE

## 2016-10-24 MED ORDER — NITROFURANTOIN MONOHYD MACRO 100 MG PO CAPS: 100.0000 mg | ORAL_CAPSULE | Freq: Two times a day (BID) | ORAL | 0 refills | Status: AC

## 2016-10-24 NOTE — Telephone Encounter (Signed)
Clinical staff, please let patient know that urine culture was positive for an ESBL+ E coli germ, sensitive to nitrofurantoin.  During hospital stay, urine culture was positive for ESBL+ Klebsiella, tx'ed with fosfomycin.   Unclear whether this is UTI or asymptomatic bacteriuria, but will send rx nitrofurantoin to pharmacy of record, [facility] in [place], based on UC visit note 10/22/16 with reported low grade fevers and persistent kidney pain.  Afebrile at visit; no urinary symptoms recorded in visit note.   Needs to establish care with a primary care provider to follow up possible persistent UTI.  If she does not have one in mind, please try to help her make an appointment with [facility] (first available provider).  LM

## 2016-10-27 ENCOUNTER — Telehealth (HOSPITAL_COMMUNITY): Payer: Self-pay | Admitting: Emergency Medicine

## 2016-10-27 ENCOUNTER — Ambulatory Visit (INDEPENDENT_AMBULATORY_CARE_PROVIDER_SITE_OTHER): Payer: BLUE CROSS/BLUE SHIELD | Admitting: Orthopedic Surgery

## 2016-10-27 NOTE — Telephone Encounter (Signed)
-----   Message from Eustace MooreLaura W Murray, MD sent at 10/24/2016  4:05 PM EST ----- Clinical staff, please let patient know that urine culture was positive for an ESBL+ E coli germ, sensitive to nitrofurantoin.  During hospital stay, urine culture was positive for ESBL+ Klebsiella, tx'ed with fosfomycin.   Unclear whether this is UTI or asymptomatic bacteriuria, but will send rx nitrofurantoin to pharmacy of record, [facility] in [place], based on UC visit note 10/22/16 with reported low grade fevers and persistent kidney pain.  Afebrile at visit; no urinary symptoms recorded in visit note.   Needs to establish care with a primary care provider to follow up possible persistent UTI.  If she does not have one in mind, please try to help her make an appointment with [facility] at [facility] (first available provider).  LM

## 2016-10-27 NOTE — Telephone Encounter (Signed)
Called [phone] but # not in service... Called [phone] and LM w/husband Sullivan Lone([place]) Called to give lab results and to see how pt is doing from recent visit on  Also let pt know labs can be obtained from MyChart Notified of pending Rx sent to pharmacy

## 2016-10-29 NOTE — Telephone Encounter (Signed)
Called [phone] but # not in service... Called [phone] but line was cut off.  Called to give lab results and to see how pt is doing from recent visit on 10/22/16 Also need to let pt know labs can be obtained from MyChart and to notify of pending Rx sent to pharmacy

## 2016-12-25 ENCOUNTER — Ambulatory Visit (INDEPENDENT_AMBULATORY_CARE_PROVIDER_SITE_OTHER): Payer: BLUE CROSS/BLUE SHIELD | Admitting: Orthopedic Surgery

## 2016-12-29 ENCOUNTER — Ambulatory Visit (INDEPENDENT_AMBULATORY_CARE_PROVIDER_SITE_OTHER): Payer: BLUE CROSS/BLUE SHIELD | Admitting: Orthopedic Surgery

## 2016-12-29 ENCOUNTER — Ambulatory Visit (INDEPENDENT_AMBULATORY_CARE_PROVIDER_SITE_OTHER): Payer: Self-pay

## 2016-12-29 ENCOUNTER — Encounter (INDEPENDENT_AMBULATORY_CARE_PROVIDER_SITE_OTHER): Payer: Self-pay | Admitting: Orthopedic Surgery

## 2016-12-29 VITALS — Ht 60.0 in | Wt 120.0 lb

## 2016-12-29 DIAGNOSIS — S92901D Unspecified fracture of right foot, subsequent encounter for fracture with routine healing: Secondary | ICD-10-CM

## 2016-12-29 DIAGNOSIS — M79671 Pain in right foot: Secondary | ICD-10-CM | POA: Diagnosis not present

## 2016-12-29 MED ORDER — OXYCODONE-ACETAMINOPHEN 5-325 MG PO TABS: 1.0000 | ORAL_TABLET | ORAL | 0 refills | Status: AC | PRN
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
## 2017-01-26 ENCOUNTER — Ambulatory Visit (INDEPENDENT_AMBULATORY_CARE_PROVIDER_SITE_OTHER): Payer: BLUE CROSS/BLUE SHIELD | Admitting: Orthopedic Surgery

## 2017-03-27 DIAGNOSIS — F329 Major depressive disorder, single episode, unspecified: Secondary | ICD-10-CM | POA: Diagnosis not present

## 2017-03-27 DIAGNOSIS — N12 Tubulo-interstitial nephritis, not specified as acute or chronic: Secondary | ICD-10-CM | POA: Diagnosis not present

## 2017-03-27 DIAGNOSIS — M549 Dorsalgia, unspecified: Secondary | ICD-10-CM | POA: Diagnosis not present

## 2017-03-28 DIAGNOSIS — F329 Major depressive disorder, single episode, unspecified: Secondary | ICD-10-CM | POA: Diagnosis not present

## 2017-03-28 DIAGNOSIS — M549 Dorsalgia, unspecified: Secondary | ICD-10-CM | POA: Diagnosis not present

## 2017-03-28 DIAGNOSIS — N12 Tubulo-interstitial nephritis, not specified as acute or chronic: Secondary | ICD-10-CM | POA: Diagnosis not present

## 2017-03-29 DIAGNOSIS — N12 Tubulo-interstitial nephritis, not specified as acute or chronic: Secondary | ICD-10-CM | POA: Diagnosis not present

## 2017-03-29 DIAGNOSIS — F329 Major depressive disorder, single episode, unspecified: Secondary | ICD-10-CM | POA: Diagnosis not present

## 2017-03-29 DIAGNOSIS — M549 Dorsalgia, unspecified: Secondary | ICD-10-CM | POA: Diagnosis not present

## 2017-03-30 DIAGNOSIS — M549 Dorsalgia, unspecified: Secondary | ICD-10-CM | POA: Diagnosis not present

## 2017-03-30 DIAGNOSIS — N12 Tubulo-interstitial nephritis, not specified as acute or chronic: Secondary | ICD-10-CM | POA: Diagnosis not present

## 2017-03-30 DIAGNOSIS — F329 Major depressive disorder, single episode, unspecified: Secondary | ICD-10-CM | POA: Diagnosis not present

## 2019-01-17 DIAGNOSIS — R079 Chest pain, unspecified: Secondary | ICD-10-CM

## 2019-01-17 DIAGNOSIS — I10 Essential (primary) hypertension: Secondary | ICD-10-CM | POA: Diagnosis not present

## 2021-12-25 ENCOUNTER — Encounter

## 2022-01-10 ENCOUNTER — Encounter

## 2022-01-15 ENCOUNTER — Ambulatory Visit: Attending: Family Medicine | Primary: Family Medicine

## 2022-01-15 ENCOUNTER — Ambulatory Visit
Admit: 2022-01-15 | Discharge: 2022-01-15 | Payer: PRIVATE HEALTH INSURANCE | Attending: Family Medicine | Primary: Family Medicine

## 2022-01-15 DIAGNOSIS — Z0000 Encounter for general adult medical examination without abnormal findings: Secondary | ICD-10-CM

## 2022-01-15 NOTE — Progress Notes (Signed)
New Patient Progress Note    Primary Care Physician: Wyatt Mage, MD     Reason for Visit:    Chief Complaint   Patient presents with    Complete Physical       History of Present Illness:  Christina Colon is a 46 y.o. female patient who presents as a new patient for annual exam.      Review of Systems:  General: No fever or chills    ENT: No earache or sore throat    Cardiac:  no chest pain or palpitations    Skin: No bleeding or bruising    Endo: No polyuria,  Polydipsia    GI: no nausea,  Vomiting    GU: no dysuria or discharge    Neuro: No headache or paresthesias    Respiratory: No cough or sputum    Muscular: No arthralgias or arthritis      Past Medical History:  No past medical history on file.    PastSurgical History:  No past surgical history on file.    Social History:  Social History     Tobacco Use   Smoking Status Never    Passive exposure: Never   Smokeless Tobacco Never       Allergy:  No Known Allergies    Medication:        .    Physical Exam:  Vitals:    01/15/22 1211   BP: 120/78   Pulse: 80   Resp: 14   Temp: 97 ??F (36.1 ??C)   SpO2: 99%   Weight: 148 lb (67.1 kg)   Height: 5\' 4"  (1.626 m)     Body mass index is 25.4 kg/m??.    General:  well-developed well-nourished no acute distress    ENT:  normal TMs, pharynx without erythema or exudate, no oral lesions, PERRLA    Neck:   supple soft no JVD    Cardiac:  regular rhythm no murmur rub or gallop    GI:  abdomen is soft nontender without masses or organomegaly    Respiratory:  symmetric expansion, resonant, clear    Lymphatics:  no cervical or supraclavicular adenopathy    Musculoskeletal:  equal strength bilaterally, gait normal    Extremities: no edema    Skin: warm and dry    Neuro: alert, oriented x 3    Labs:  glucose 96, A1C 5.4, Cr  0.7, chol 177, LDL 105, HDL 56, triglyc 81, Hgb 9.9, MCV 76 TSH 1.2, vit D 27, B12 415    EKG:  Sinus rhythm, rate 74, normal EKG    Depression Risk Factor Screening     3 most recent PHQ Screens 01/15/2022    Little interest or pleasure in doing things Not at all   Feeling down, depressed, irritable, or hopeless Not at all   Total Score PHQ 2 0   Trouble falling or staying asleep, or sleeping too much Not at all   Feeling tired or having little energy Not at all   Poor appetite, weight loss, or overeating Not at all   Feeling bad about yourself - or that you are a failure or have let yourself or your family down Not at all   Trouble concentrating on things such as school, work, reading, or watching TV Not at all   Moving or speaking so slowly that other people could have noticed; or the opposite being so fidgety that others notice Not at all   Thoughts of being  better off dead, or hurting yourself in some way Not at all   PHQ 9 Score 0   How difficult have these problems made it for you to do your work, take care of your home and get along with others Not difficult at all       Depression screeningNegative, reviewed.      CAGE-AID Screening     CAGE - AID 01/15/2022   Have you ever felt the need to cut down on your drinking or drug use? No   Have people annoyed you by criticizing your drinking or drug use? 0   Have you ever felt guilty about drinking or drug use? 0   Have you ever had a drink or used drugs first thing in the morning to steady your nerves or to get rid of a hangover (eye-opener)?  0   CAGE AID Score - >= 2 is considered clinically significant 0       CAGE-AID screening Negative, reviewed.        No results found for this visit on 01/15/22.    No past medical history on file.        Impression/Plan:     Iron def anemia- pt asymptomatic, no prior labs for comparison, states no known history of anemia.  Current menses pattern is bleeding for up to 3 days with regular intervals and no heavy bleeding.  Will evaluate with hematology consults and GI consults.  Preventive care- mammogram ordered.  Referral for GYN annual exam. Tdap given left arm.                Wyatt Mage, MD

## 2022-02-03 ENCOUNTER — Ambulatory Visit
Admit: 2022-02-03 | Discharge: 2022-02-03 | Payer: MEDICAID | Attending: Hematology & Oncology | Primary: Family Medicine

## 2022-02-03 ENCOUNTER — Inpatient Hospital Stay: Admit: 2022-02-03 | Payer: MEDICAID | Attending: Hematology & Oncology | Primary: Family Medicine

## 2022-02-03 DIAGNOSIS — D509 Iron deficiency anemia, unspecified: Secondary | ICD-10-CM

## 2022-02-03 LAB — CBC WITH AUTOMATED DIFF
ABS. BASOPHILS: 0 10*3/uL (ref 0.0–0.4)
ABS. EOSINOPHILS: 0.1 10*3/uL (ref 0.0–1.0)
ABS. IMM. GRANS.: 0 10*3/uL (ref 0.0–0.17)
ABS. LYMPHOCYTES: 1.2 10*3/uL (ref 0.9–4.2)
ABS. MONOCYTES: 0.4 10*3/uL (ref 0.1–1.7)
ABS. NEUTROPHILS: 3.3 10*3/uL (ref 2.3–7.6)
ABSOLUTE NRBC: 0 10*3/uL (ref 0.0–0.01)
BASOPHILS: 0 % (ref 0.0–3.0)
EOSINOPHILS: 1 % (ref 0.0–7.0)
HCT: 34.7 % — ABNORMAL LOW (ref 36.0–47.0)
HGB: 10.2 g/dL — ABNORMAL LOW (ref 12.0–16.0)
IMMATURE GRANULOCYTES: 0 % (ref 0–0.5)
LYMPHOCYTES: 24 % (ref 18.0–40.0)
MCH: 23.7 PG — ABNORMAL LOW (ref 27.0–35.0)
MCHC: 29.4 g/dL — ABNORMAL LOW (ref 30.7–37.3)
MCV: 80.7 FL — ABNORMAL LOW (ref 81.0–94.0)
MONOCYTES: 8 % (ref 2.0–12.0)
MPV: 11.5 fL (ref 9.2–11.8)
NEUTROPHILS: 66 % (ref 48.0–72.0)
NRBC: 0 /100{WBCs}
PLATELET: 309 10*3/uL (ref 130–400)
RBC: 4.3 M/uL (ref 4.20–5.40)
RDW: 21.4 % — ABNORMAL HIGH (ref 11.5–14.0)
WBC: 5 10*3/uL (ref 4.8–10.6)

## 2022-02-03 LAB — METABOLIC PANEL, COMPREHENSIVE
A-G Ratio: 1.1 (ref 0.7–2.8)
ALT (SGPT): 15 U/L (ref 10–49)
AST (SGOT): 15 U/L (ref 0–33.9)
Albumin: 4 g/dL (ref 3.48–5.0)
Alk. phosphatase: 68 U/L (ref 46–116)
Anion gap: 10 mmol/L (ref 10–20)
BUN: 8 mg/dL — ABNORMAL LOW (ref 9–23)
Bilirubin, total: 0.7 mg/dL (ref 0.3–1.2)
CO2: 25 mmol/L (ref 20.0–31.0)
Calcium: 8.8 mg/dL (ref 8.7–10.4)
Chloride: 107 mmol/L (ref 98–107)
Creatinine: 0.66 mg/dL (ref 0.55–1.02)
GFR est AA: >60 mL/min/{1.73_m2} (ref >60)
GFR est non-AA: >60 mL/min/{1.73_m2} (ref >60)
Globulin: 3.5 g/dL (ref 1.7–4.7)
Glucose: 100 mg/dL (ref 74–106)
Potassium: 3.8 mmol/L (ref 3.4–5.1)
Protein, total: 7.5 g/dL (ref 5.7–8.2)
Sodium: 139 mmol/L (ref 136–145)

## 2022-02-03 LAB — RETICULOCYTE COUNT
Absolute Retic Cnt.: 0.0589 M/ul (ref 0.023–0.120)
Absolute Retic Cnt.: 0.0589 M/ul (ref 0.023–0.120)
Immature Retic Fraction: 25.1 % — ABNORMAL HIGH (ref 3.0–15.9)
Immature Retic Fraction: 25.1 % — ABNORMAL HIGH (ref 3.0–15.9)
Retic Ct Pct: 1.4 % (ref 0.6–2.0)
Reticulocyte count: 1.4 % (ref 0.6–2.0)

## 2022-02-03 LAB — IRON PROFILE
Iron % saturation: 60 % — ABNORMAL HIGH (ref 15–55)
Iron: 221 ug/dL — ABNORMAL HIGH (ref 50–170)
TIBC: 370 ug/dL (ref 250–425)

## 2022-02-03 LAB — SED RATE, AUTOMATED: Sed rate, automated: 14 mm/h (ref 0–30)

## 2022-02-03 LAB — FERRITIN
Ferritin: 20 NG/ML (ref 7.3–270.7)
Ferritin: 20 ng/mL (ref 7.3–270.7)

## 2022-02-03 LAB — VITAMIN B12 & FOLATE
Folate: 30.3 ng/mL — ABNORMAL HIGH (ref 5.38–24.00)
Folate: 30.3 ng/mL — ABNORMAL HIGH (ref 5.38–24.00)
Vitamin B-12: 368 pg/mL (ref 211–911)
Vitamin B12: 368 pg/mL (ref 211–911)

## 2022-02-03 LAB — COMPREHENSIVE METABOLIC PANEL
ALT: 15 U/L (ref 10–49)
AST: 15 U/L (ref 0–33.9)
Albumin/Globulin Ratio: 1.1 (ref 0.7–2.8)
Albumin: 4 g/dL (ref 3.48–5.0)
Alkaline Phosphatase: 68 U/L (ref 46–116)
Anion Gap: 10 mmol/L (ref 10–20)
BUN: 8 mg/dL — ABNORMAL LOW (ref 9–23)
CO2: 25 mmol/L (ref 20.0–31.0)
Calcium: 8.8 mg/dL (ref 8.7–10.4)
Chloride: 107 mmol/L (ref 98–107)
Creatinine: 0.66 mg/dL (ref 0.55–1.02)
EGFR IF NonAfrican American: >60 mL/min/{1.73_m2} (ref >60)
GFR African American: >60 mL/min/{1.73_m2} (ref >60)
Globulin: 3.5 g/dL (ref 1.7–4.7)
Glucose: 100 mg/dL (ref 74–106)
Potassium: 3.8 mmol/L (ref 3.4–5.1)
Sodium: 139 mmol/L (ref 136–145)
Total Bilirubin: 0.7 mg/dL (ref 0.3–1.2)
Total Protein: 7.5 g/dL (ref 5.7–8.2)

## 2022-02-03 LAB — CBC WITH AUTO DIFFERENTIAL
Basophils %: 0 % (ref 0.0–3.0)
Basophils Absolute: 0 10*3/uL (ref 0.0–0.4)
Eosinophils %: 1 % (ref 0.0–7.0)
Eosinophils Absolute: 0.1 10*3/uL (ref 0.0–1.0)
Granulocyte Absolute Count: 0 10*3/uL (ref 0.0–0.17)
Hematocrit: 34.7 % — ABNORMAL LOW (ref 36.0–47.0)
Hemoglobin: 10.2 g/dL — ABNORMAL LOW (ref 12.0–16.0)
Immature Granulocytes: 0 % (ref 0–0.5)
Lymphocytes %: 24 % (ref 18.0–40.0)
Lymphocytes Absolute: 1.2 10*3/uL (ref 0.9–4.2)
MCH: 23.7 PG — ABNORMAL LOW (ref 27.0–35.0)
MCHC: 29.4 g/dL — ABNORMAL LOW (ref 30.7–37.3)
MCV: 80.7 FL — ABNORMAL LOW (ref 81.0–94.0)
MPV: 11.5 FL (ref 9.2–11.8)
Monocytes %: 8 % (ref 2.0–12.0)
Monocytes Absolute: 0.4 10*3/uL (ref 0.1–1.7)
NRBC Absolute: 0 10*3/uL (ref 0.0–0.01)
Neutrophils %: 66 % (ref 48.0–72.0)
Neutrophils Absolute: 3.3 10*3/uL (ref 2.3–7.6)
Nucleated RBCs: 0 PER 100 WBC
Platelets: 309 10*3/uL (ref 130–400)
RBC: 4.3 M/uL (ref 4.20–5.40)
RDW: 21.4 % — ABNORMAL HIGH (ref 11.5–14.0)
WBC: 5 10*3/uL (ref 4.8–10.6)

## 2022-02-03 LAB — IRON AND TIBC
Iron Saturation: 60 % — ABNORMAL HIGH (ref 15–55)
Iron: 221 ug/dL — ABNORMAL HIGH (ref 50–170)
TIBC: 370 ug/dL (ref 250–425)

## 2022-02-03 LAB — SEDIMENTATION RATE, AUTOMATED: Sed Rate: 14 mm/hr (ref 0–30)

## 2022-02-03 MED ORDER — FERROUS SULFATE 325 MG (65 MG ELEMENTAL IRON) TAB, DELAYED RELEASE
325 mg (65 mg iron) | ORAL_TABLET | Freq: Every day | ORAL | 1 refills | Status: AC
Start: 2022-02-03 — End: ?

## 2022-02-06 ENCOUNTER — Inpatient Hospital Stay: Admit: 2022-02-06 | Payer: MEDICAID | Attending: Family Medicine | Primary: Family Medicine

## 2022-02-06 DIAGNOSIS — Z1231 Encounter for screening mammogram for malignant neoplasm of breast: Secondary | ICD-10-CM

## 2022-03-17 ENCOUNTER — Ambulatory Visit: Attending: Hematology & Oncology | Primary: Family Medicine

## 2022-03-17 ENCOUNTER — Inpatient Hospital Stay: Admit: 2022-03-17 | Payer: MEDICAID | Attending: Hematology & Oncology | Primary: Family Medicine

## 2022-03-17 ENCOUNTER — Ambulatory Visit
Admit: 2022-03-17 | Discharge: 2022-03-17 | Payer: MEDICAID | Attending: Hematology & Oncology | Primary: Family Medicine

## 2022-03-17 DIAGNOSIS — D509 Iron deficiency anemia, unspecified: Secondary | ICD-10-CM

## 2022-03-17 LAB — CBC WITH AUTOMATED DIFF
ABS. BASOPHILS: 0 10*3/uL (ref 0.0–0.4)
ABS. EOSINOPHILS: 0.1 10*3/uL (ref 0.0–1.0)
ABS. IMM. GRANS.: 0 10*3/uL (ref 0.0–0.17)
ABS. LYMPHOCYTES: 1.5 10*3/uL (ref 0.9–4.2)
ABS. MONOCYTES: 0.5 10*3/uL (ref 0.1–1.7)
ABS. NEUTROPHILS: 4.2 10*3/uL (ref 2.3–7.6)
ABSOLUTE NRBC: 0 10*3/uL (ref 0.0–0.01)
BASOPHILS: 0 % (ref 0.0–3.0)
EOSINOPHILS: 1 % (ref 0.0–7.0)
HCT: 37.9 % (ref 36.0–47.0)
HGB: 11.7 g/dL — ABNORMAL LOW (ref 12.0–16.0)
IMMATURE GRANULOCYTES: 0 % (ref 0–0.5)
LYMPHOCYTES: 24 % (ref 18.0–40.0)
MCH: 25.4 PG — ABNORMAL LOW (ref 27.0–35.0)
MCHC: 30.9 g/dL (ref 30.7–37.3)
MCV: 82.4 FL (ref 81.0–94.0)
MONOCYTES: 8 % (ref 2.0–12.0)
MPV: 11.8 FL (ref 9.2–11.8)
NEUTROPHILS: 67 % (ref 48.0–72.0)
NRBC: 0 PER 100 WBC
PLATELET: 291 10*3/uL (ref 130–400)
RBC: 4.6 M/uL (ref 4.20–5.40)
RDW: 21.2 % — ABNORMAL HIGH (ref 11.5–14.0)
WBC: 6.2 10*3/uL (ref 4.8–10.6)

## 2022-03-17 LAB — CBC WITH AUTO DIFFERENTIAL
Basophils %: 0 % (ref 0.0–3.0)
Basophils Absolute: 0 10*3/uL (ref 0.0–0.4)
Eosinophils %: 1 % (ref 0.0–7.0)
Eosinophils Absolute: 0.1 10*3/uL (ref 0.0–1.0)
Granulocyte Absolute Count: 0 10*3/uL (ref 0.0–0.17)
Hematocrit: 37.9 % (ref 36.0–47.0)
Hemoglobin: 11.7 g/dL — ABNORMAL LOW (ref 12.0–16.0)
Immature Granulocytes: 0 % (ref 0–0.5)
Lymphocytes %: 24 % (ref 18.0–40.0)
Lymphocytes Absolute: 1.5 10*3/uL (ref 0.9–4.2)
MCH: 25.4 PG — ABNORMAL LOW (ref 27.0–35.0)
MCHC: 30.9 g/dL (ref 30.7–37.3)
MCV: 82.4 FL (ref 81.0–94.0)
MPV: 11.8 FL (ref 9.2–11.8)
Monocytes %: 8 % (ref 2.0–12.0)
Monocytes Absolute: 0.5 10*3/uL (ref 0.1–1.7)
NRBC Absolute: 0 10*3/uL (ref 0.0–0.01)
Neutrophils %: 67 % (ref 48.0–72.0)
Neutrophils Absolute: 4.2 10*3/uL (ref 2.3–7.6)
Nucleated RBCs: 0 PER 100 WBC
Platelets: 291 10*3/uL (ref 130–400)
RBC: 4.6 M/uL (ref 4.20–5.40)
RDW: 21.2 % — ABNORMAL HIGH (ref 11.5–14.0)
WBC: 6.2 10*3/uL (ref 4.8–10.6)

## 2022-03-17 NOTE — Progress Notes (Signed)
Progress  Notes by Irena Cords, MD at 03/17/22 1300                Author: Irena Cords, MD  Service: --  Author Type: Physician       Filed: 03/17/22 1341  Encounter Date: 03/17/2022  Status: Signed          Editor: Irena Cords, MD (Physician)               Hematology and Oncology Follow up Note      Christina Colon   [id number]      [id number]   Dec 16, 1975      PRESENTING COMPLAINT: E and M of Fe def anemia       HISTORY OF PRESENT ILLNESS:   This is a 46 year old woman w -  NO PMH  NO PSH  NO FHx of colon disease   She has menorrhagia - her period lasts 3 days and is heavy throughout   She has 2 children delivered via [facility]   She has no BRBPR or melena   She is not prone to constipation    She is NOT symptomatic   No pica, no DOE or SOB, no palpitations, no restless legs   Byrd Hesselbach had not been placed on po Fe      Her Hgb WAS 10.2 and her MCV WAS 81 > Hgb of 11.7 and MCV of 82.4 TODAY 03/17/2022   MUCH IMPROVED ON po Fe once daily       WORK UP -    B12 - 368   Fe 221, sat 60%, ferritin 20      A -    Fe def related to her GYN  hx    Anemia secondary to Fe def    The anemia is mild and she is NOT symptomatic      MUCH IMPROVED ON PO Fe supplementation      Therefore recommend -   CONTpo Fe supplementation      Follow up in 4 mo and if doing well then adjust dosing of po Fe     D/W her at length today     Further recommendations to follow     Thank you,   Irena Cords, MD      DX      Encounter Diagnosis        Name  Primary?         ?  Iron deficiency anemia, unspecified iron deficiency anemia type  Yes            No past medical history on file.    No past surgical history on file.      Current Outpatient Medications        Medication  Sig         ?  ferrous sulfate (IRON) 325 mg (65 mg iron) EC tablet  Take 1 Tablet by mouth daily.          No current facility-administered medications for this visit.         No Known Allergies      Social History          Tobacco Use         ?  Smoking status:  Never               Passive exposure:  Never         ?  Smokeless tobacco:  Never       Substance  Use Topics         ?  Alcohol use:  No           Family History         Problem  Relation  Age of Onset          ?  Diabetes  Mother       ?  Diabetes  Father            ?  Diabetes  Sister              Review of Systems:   A detailed 10 organ review of systems is obtained with pertinent positives as listed in the History of Present Illness and Past Medical History. All others are negative.      Physical Exam:   Visit Vitals      BP  136/63     Pulse  70     Temp  96.9 F (36.1 C) (Skin)     Wt  155 lb (70.3 kg)        BMI  26.61 kg/m              General appearance   alert, cooperative, no distress, appears stated age     Head   Normocephalic, without obvious abnormality, atraumatic     Eyes   PERRL, EOM's intact.     Nose  Nares normal. Septum midline. Mucosa normal. No drainage or sinus tenderness.        Throat  Lips, mucosa, and tongue normal. Teeth and gums normal.  Oropharynx clear.        Neck  supple, symmetrical, trachea midline, no adenopathy, thyroid: not enlarged, symmetric, no tenderness/mass/nodules,no JVD     Back    No midline or CVA tenderness     Lungs    clear to auscultation bilaterally     Heart   regular rate and rhythm, S1, S2 normal, no murmur, click, rub or gallop        Abdomen    soft, non-tender, non-distended. Bowel sounds normal. No masses,  No organomegaly        Extremities  extremities normal, atraumatic, no cyanosis or edema        Skin  Skin color, texture, turgor normal. No rashes or lesions        Lymph nodes  Nonpalpable        Neurologic  Nonfocal        Labs:    No results found for this or any previous visit (from the past 24 hour(s)).                ICD-10-CM  ICD-9-CM             1.  Iron deficiency anemia, unspecified iron deficiency anemia type   D50.9  280.9                   Follow-up and Dispositions        Return in about 6 months (around 09/17/2022) for Follow up .             Irena Cords, MD   03/17/2022      The billing code submitted in association with this evaluation also includes the time to review patient's prior records in the [facility] system, communicate with the physician team and nursing, and to  obtain corroborating data and discuss the risk and benefits of the proposed management plan with the patient and/or their  family.

## 2022-04-17 ENCOUNTER — Ambulatory Visit: Attending: Family Medicine | Primary: Family Medicine

## 2022-04-17 ENCOUNTER — Ambulatory Visit
Admit: 2022-04-17 | Discharge: 2022-04-17 | Payer: PRIVATE HEALTH INSURANCE | Attending: Family Medicine | Primary: Family Medicine

## 2022-04-17 DIAGNOSIS — D509 Iron deficiency anemia, unspecified: Secondary | ICD-10-CM

## 2022-04-17 NOTE — Progress Notes (Signed)
Progress Notes by Wyatt Magereech, Andriea Hasegawa L, MD at 04/17/22 1030                Author: Wyatt Magereech, Reynaldo Rossman L, MD  Service: --  Author Type: Physician       Filed: 04/17/22 1249  Encounter Date: 04/17/2022  Status: Signed          Editor: Wyatt Magereech, Jaskirat Zertuche L, MD (Physician)                  Office Progress Note      Primary Care Physician: Wyatt Magereech, Martia Dalby L, MD       Reason for Visit:       Chief Complaint       Patient presents with        ?  Anemia             Patient is here for anemia.           History of Present Illness:  Christina Colon is a 46 y.o. female patient who presents for anemia followup.  Pt had hematology consult with recommendation to continue ferrous sulfate 325 mg daily as  Hgb has significantly improved from prior level 9.9 to current level 11.7.         Review of Systems:   General: No fever or chills      ENT: No earache or sore throat      Cardiac:  no chest pain or palpitations      Skin: No bleeding or bruising      Endo: No polyuria, polydipsia      GI: no nausea, vomiting      GU: no dysuria or discharge      Neuro: No headache or paresthesias      Respiratory: No cough or sputum      Muscular: No arthralgias or arthritis         Past Medical History:   History reviewed. No pertinent past medical history.      PastSurgical History:   History reviewed. No pertinent surgical history.      Social History:     Social History          Tobacco Use        Smoking Status  Never         ?  Passive exposure:  Never        Smokeless Tobacco  Never           Allergy:   No Known Allergies      Medication:     Current Outpatient Medications          Medication  Sig  Dispense  Refill           ?  ferrous sulfate (IRON) 325 mg (65 mg iron) EC tablet  Take 1 Tablet by mouth daily.  90 Tablet  1              .      Physical Exam:     Vitals:          04/17/22 1036        BP:  120/80     Pulse:  77     Resp:  15     Temp:  97.5 F (36.4 C)     SpO2:  97%     Weight:  154 lb (69.9 kg)        Height:  5'  4" (1.626 m)  Body mass index is 26.43 kg/m.      General:  well-developed well-nourished no acute distress      ENT:  normal       Neck:   supple soft no JVD      Cardiac:  regular rhythm no murmur rub or gallop      GI:  abdomen is soft nontender without masses or organomegaly      Respiratory:  symmetric expansion, lungs bilaterally clear      Lymphatics:  no cervical adenopathy      Musculoskeletal:  equal strength bilaterally, gait normal      Extremities: no edema      Skin: warm and dry      Neuro: alert, oriented x 3      Labs:  Hgb 11.7, MCV 82.4         No results found for this visit on 04/17/22.      History reviewed. No pertinent past medical history.            Impression/Plan:      1.   Iron def anemia- stable Hgb on ferrous sulfate 325 mg daily.  Repeat CBC in 4 months, continue ferrous sulfate 325 mg daily, referral  to GI for colonoscopy, rule out GI bleed.                        Wyatt Mage, MD

## 2022-07-28 ENCOUNTER — Encounter: Attending: Hematology & Oncology | Primary: Family Medicine

## 2022-08-18 ENCOUNTER — Encounter: Attending: Family Medicine | Primary: Family Medicine

## 2024-02-16 ENCOUNTER — Encounter: Payer: Medicaid Other | Admitting: Advanced Practice Midwife

## 2024-03-31 ENCOUNTER — Encounter: Payer: Self-pay | Admitting: Internal Medicine
